# Patient Record
Sex: Female | Born: 1950 | Race: Black or African American | Hispanic: No | Marital: Married | Smoking: Former smoker
Health system: Southern US, Community
[De-identification: ages and names within clinical notes are randomized; demographics above are authoritative.]

## PROBLEM LIST (undated history)

## (undated) DIAGNOSIS — M65342 Trigger finger, left ring finger: Secondary | ICD-10-CM

## (undated) DIAGNOSIS — E7800 Pure hypercholesterolemia, unspecified: Secondary | ICD-10-CM

## (undated) DIAGNOSIS — I6529 Occlusion and stenosis of unspecified carotid artery: Secondary | ICD-10-CM

## (undated) DIAGNOSIS — E039 Hypothyroidism, unspecified: Secondary | ICD-10-CM

## (undated) DIAGNOSIS — F419 Anxiety disorder, unspecified: Secondary | ICD-10-CM

## (undated) DIAGNOSIS — M1711 Unilateral primary osteoarthritis, right knee: Secondary | ICD-10-CM

## (undated) DIAGNOSIS — T7840XA Allergy, unspecified, initial encounter: Secondary | ICD-10-CM

## (undated) DIAGNOSIS — I10 Essential (primary) hypertension: Secondary | ICD-10-CM

## (undated) HISTORY — DX: Allergy, unspecified, initial encounter: T78.40XA

## (undated) HISTORY — DX: Unilateral primary osteoarthritis, right knee: M17.11

## (undated) HISTORY — PX: TUBAL LIGATION: SHX77

## (undated) HISTORY — DX: Essential (primary) hypertension: I10

## (undated) HISTORY — PX: ABDOMINAL HYSTERECTOMY: SHX81

## (undated) HISTORY — DX: Hypothyroidism, unspecified: E03.9

## (undated) HISTORY — PX: THYROIDECTOMY: SHX17

## (undated) HISTORY — PX: SHOULDER SURGERY: SHX246

## (undated) HISTORY — DX: Occlusion and stenosis of unspecified carotid artery: I65.29

---

## 1999-02-01 ENCOUNTER — Encounter: Payer: Self-pay | Admitting: Family Medicine

## 1999-02-01 ENCOUNTER — Encounter: Admission: RE | Admit: 1999-02-01 | Discharge: 1999-02-01 | Payer: Self-pay | Admitting: *Deleted

## 2000-03-21 ENCOUNTER — Encounter: Admission: RE | Admit: 2000-03-21 | Discharge: 2000-03-21 | Payer: Self-pay | Admitting: Family Medicine

## 2000-03-21 ENCOUNTER — Encounter: Payer: Self-pay | Admitting: Family Medicine

## 2000-08-20 ENCOUNTER — Encounter: Admission: RE | Admit: 2000-08-20 | Discharge: 2000-08-20 | Payer: Self-pay | Admitting: Family Medicine

## 2000-08-20 ENCOUNTER — Encounter: Payer: Self-pay | Admitting: Family Medicine

## 2001-03-22 ENCOUNTER — Encounter: Admission: RE | Admit: 2001-03-22 | Discharge: 2001-03-22 | Payer: Self-pay | Admitting: Family Medicine

## 2001-03-22 ENCOUNTER — Encounter: Payer: Self-pay | Admitting: Family Medicine

## 2001-05-07 ENCOUNTER — Other Ambulatory Visit: Admission: RE | Admit: 2001-05-07 | Discharge: 2001-05-07 | Payer: Self-pay | Admitting: Family Medicine

## 2001-08-14 ENCOUNTER — Encounter: Admission: RE | Admit: 2001-08-14 | Discharge: 2001-11-12 | Payer: Self-pay | Admitting: Family Medicine

## 2002-08-18 ENCOUNTER — Encounter: Admission: RE | Admit: 2002-08-18 | Discharge: 2002-08-18 | Payer: Self-pay | Admitting: Family Medicine

## 2002-08-18 ENCOUNTER — Encounter: Payer: Self-pay | Admitting: Family Medicine

## 2003-01-05 ENCOUNTER — Encounter: Admission: RE | Admit: 2003-01-05 | Discharge: 2003-01-05 | Payer: Self-pay | Admitting: Family Medicine

## 2003-02-18 ENCOUNTER — Encounter (INDEPENDENT_AMBULATORY_CARE_PROVIDER_SITE_OTHER): Payer: Self-pay | Admitting: *Deleted

## 2003-02-18 ENCOUNTER — Ambulatory Visit (HOSPITAL_COMMUNITY): Admission: RE | Admit: 2003-02-18 | Discharge: 2003-02-18 | Payer: Self-pay | Admitting: Surgery

## 2003-08-17 ENCOUNTER — Encounter: Admission: RE | Admit: 2003-08-17 | Discharge: 2003-08-17 | Payer: Self-pay | Admitting: Family Medicine

## 2003-09-16 ENCOUNTER — Other Ambulatory Visit: Admission: RE | Admit: 2003-09-16 | Discharge: 2003-09-16 | Payer: Self-pay | Admitting: Family Medicine

## 2004-01-01 ENCOUNTER — Emergency Department (HOSPITAL_COMMUNITY): Admission: EM | Admit: 2004-01-01 | Discharge: 2004-01-01 | Payer: Self-pay | Admitting: Emergency Medicine

## 2004-09-15 ENCOUNTER — Encounter: Admission: RE | Admit: 2004-09-15 | Discharge: 2004-09-15 | Payer: Self-pay | Admitting: Family Medicine

## 2004-09-22 ENCOUNTER — Other Ambulatory Visit: Admission: RE | Admit: 2004-09-22 | Discharge: 2004-09-22 | Payer: Self-pay | Admitting: Family Medicine

## 2005-04-11 ENCOUNTER — Encounter: Admission: RE | Admit: 2005-04-11 | Discharge: 2005-04-11 | Payer: Self-pay | Admitting: Family Medicine

## 2005-12-18 ENCOUNTER — Encounter: Admission: RE | Admit: 2005-12-18 | Discharge: 2005-12-18 | Payer: Self-pay | Admitting: Family Medicine

## 2006-01-01 ENCOUNTER — Encounter: Admission: RE | Admit: 2006-01-01 | Discharge: 2006-01-01 | Payer: Self-pay | Admitting: Family Medicine

## 2006-06-16 ENCOUNTER — Emergency Department (HOSPITAL_COMMUNITY): Admission: EM | Admit: 2006-06-16 | Discharge: 2006-06-16 | Payer: Self-pay | Admitting: Emergency Medicine

## 2007-01-25 ENCOUNTER — Encounter: Admission: RE | Admit: 2007-01-25 | Discharge: 2007-01-25 | Payer: Self-pay | Admitting: Family Medicine

## 2008-01-01 ENCOUNTER — Other Ambulatory Visit: Admission: RE | Admit: 2008-01-01 | Discharge: 2008-01-01 | Payer: Self-pay | Admitting: Family Medicine

## 2008-01-03 ENCOUNTER — Encounter: Admission: RE | Admit: 2008-01-03 | Discharge: 2008-01-03 | Payer: Self-pay | Admitting: Family Medicine

## 2008-03-19 ENCOUNTER — Encounter: Admission: RE | Admit: 2008-03-19 | Discharge: 2008-03-19 | Payer: Self-pay | Admitting: Family Medicine

## 2009-01-15 ENCOUNTER — Encounter: Admission: RE | Admit: 2009-01-15 | Discharge: 2009-01-15 | Payer: Self-pay | Admitting: Family Medicine

## 2009-02-27 HISTORY — PX: KNEE SURGERY: SHX244

## 2009-03-18 ENCOUNTER — Emergency Department (HOSPITAL_COMMUNITY): Admission: EM | Admit: 2009-03-18 | Discharge: 2009-03-18 | Payer: Self-pay | Admitting: Emergency Medicine

## 2009-05-10 ENCOUNTER — Inpatient Hospital Stay (HOSPITAL_COMMUNITY): Admission: RE | Admit: 2009-05-10 | Discharge: 2009-05-12 | Payer: Self-pay | Admitting: Orthopedic Surgery

## 2009-05-18 ENCOUNTER — Inpatient Hospital Stay (HOSPITAL_COMMUNITY): Admission: EM | Admit: 2009-05-18 | Discharge: 2009-05-19 | Payer: Self-pay | Admitting: Emergency Medicine

## 2009-05-18 ENCOUNTER — Ambulatory Visit: Payer: Self-pay | Admitting: Vascular Surgery

## 2009-05-18 ENCOUNTER — Ambulatory Visit: Payer: Self-pay | Admitting: Oncology

## 2009-05-23 ENCOUNTER — Emergency Department (HOSPITAL_COMMUNITY): Admission: EM | Admit: 2009-05-23 | Discharge: 2009-05-23 | Payer: Self-pay | Admitting: Emergency Medicine

## 2009-06-15 ENCOUNTER — Ambulatory Visit (HOSPITAL_COMMUNITY): Admission: RE | Admit: 2009-06-15 | Discharge: 2009-06-15 | Payer: Self-pay | Admitting: Urology

## 2009-07-23 ENCOUNTER — Encounter: Admission: RE | Admit: 2009-07-23 | Discharge: 2009-07-23 | Payer: Self-pay | Admitting: Urology

## 2010-02-11 ENCOUNTER — Encounter
Admission: RE | Admit: 2010-02-11 | Discharge: 2010-02-11 | Payer: Self-pay | Source: Home / Self Care | Attending: Family Medicine | Admitting: Family Medicine

## 2010-03-19 ENCOUNTER — Encounter: Payer: Self-pay | Admitting: Family Medicine

## 2010-03-20 ENCOUNTER — Encounter: Payer: Self-pay | Admitting: Family Medicine

## 2010-03-20 ENCOUNTER — Encounter: Payer: Self-pay | Admitting: Urology

## 2010-04-27 ENCOUNTER — Other Ambulatory Visit: Payer: Self-pay | Admitting: Family Medicine

## 2010-05-17 LAB — COMPREHENSIVE METABOLIC PANEL
ALT: 23 U/L (ref 0–35)
AST: 27 U/L (ref 0–37)
Albumin: 3.5 g/dL (ref 3.5–5.2)
Alkaline Phosphatase: 76 U/L (ref 39–117)
BUN: 9 mg/dL (ref 6–23)
CO2: 31 mEq/L (ref 19–32)
Calcium: 9.7 mg/dL (ref 8.4–10.5)
Chloride: 105 mEq/L (ref 96–112)
Creatinine, Ser: 0.96 mg/dL (ref 0.4–1.2)
GFR calc Af Amer: >60 mL/min (ref >60)
GFR calc non Af Amer: 60 mL/min — ABNORMAL LOW (ref >60)
Glucose, Bld: 90 mg/dL (ref 70–99)
Potassium: 4.4 mEq/L (ref 3.5–5.1)
Sodium: 142 mEq/L (ref 135–145)
Total Bilirubin: 0.5 mg/dL (ref 0.3–1.2)
Total Protein: 7.4 g/dL (ref 6.0–8.3)

## 2010-05-17 LAB — CBC
HCT: 33.7 % — ABNORMAL LOW (ref 36.0–46.0)
Hemoglobin: 11 g/dL — ABNORMAL LOW (ref 12.0–15.0)
MCHC: 32.6 g/dL (ref 30.0–36.0)
MCV: 93.4 fL (ref 78.0–100.0)
Platelets: 328 10*3/uL (ref 150–400)
RBC: 3.6 MIL/uL — ABNORMAL LOW (ref 3.87–5.11)
RDW: 14 % (ref 11.5–15.5)
WBC: 5.3 10*3/uL (ref 4.0–10.5)

## 2010-05-17 LAB — PROTIME-INR
INR: 1.16 (ref 0.00–1.49)
INR: 1.65 — ABNORMAL HIGH (ref 0.00–1.49)
Prothrombin Time: 14.7 seconds (ref 11.6–15.2)
Prothrombin Time: 19.4 seconds — ABNORMAL HIGH (ref 11.6–15.2)

## 2010-05-17 LAB — APTT: aPTT: 43 seconds — ABNORMAL HIGH (ref 24–37)

## 2010-05-22 LAB — POCT I-STAT, CHEM 8
BUN: 12 mg/dL (ref 6–23)
BUN: 15 mg/dL (ref 6–23)
Calcium, Ion: 1.09 mmol/L — ABNORMAL LOW (ref 1.12–1.32)
Chloride: 105 mEq/L (ref 96–112)
Creatinine, Ser: 1.1 mg/dL (ref 0.4–1.2)
Creatinine, Ser: 1.2 mg/dL (ref 0.4–1.2)
Glucose, Bld: 106 mg/dL — ABNORMAL HIGH (ref 70–99)
Glucose, Bld: 92 mg/dL (ref 70–99)
HCT: 32 % — ABNORMAL LOW (ref 36.0–46.0)
Hemoglobin: 10.5 g/dL — ABNORMAL LOW (ref 12.0–15.0)
Hemoglobin: 10.9 g/dL — ABNORMAL LOW (ref 12.0–15.0)
Potassium: 3.9 mEq/L (ref 3.5–5.1)
Sodium: 137 meq/L (ref 135–145)
TCO2: 27 mmol/L (ref 0–100)
TCO2: 32 mmol/L (ref 0–100)

## 2010-05-22 LAB — DIFFERENTIAL
Basophils Relative: 0 % (ref 0–1)
Eosinophils Absolute: 0.5 10*3/uL (ref 0.0–0.7)
Eosinophils Relative: 5 % (ref 0–5)
Lymphs Abs: 1.8 10*3/uL (ref 0.7–4.0)
Monocytes Relative: 5 % (ref 3–12)

## 2010-05-22 LAB — CBC
HCT: 31.7 % — ABNORMAL LOW (ref 36.0–46.0)
Hemoglobin: 10.4 g/dL — ABNORMAL LOW (ref 12.0–15.0)
Hemoglobin: 9.8 g/dL — ABNORMAL LOW (ref 12.0–15.0)
MCHC: 33.3 g/dL (ref 30.0–36.0)
MCHC: 33.4 g/dL (ref 30.0–36.0)
MCHC: 33.8 g/dL (ref 30.0–36.0)
MCV: 94.1 fL (ref 78.0–100.0)
Platelets: 383 10*3/uL (ref 150–400)
Platelets: 534 10*3/uL — ABNORMAL HIGH (ref 150–400)
RBC: 3.24 MIL/uL — ABNORMAL LOW (ref 3.87–5.11)
RBC: 3.37 MIL/uL — ABNORMAL LOW (ref 3.87–5.11)
RDW: 13.5 % (ref 11.5–15.5)
RDW: 13.7 % (ref 11.5–15.5)
WBC: 11.2 10*3/uL — ABNORMAL HIGH (ref 4.0–10.5)

## 2010-05-22 LAB — LUPUS ANTICOAGULANT PANEL
Lupus Anticoagulant: NOT DETECTED
PTTLA 4:1 Mix: 84.5 secs — ABNORMAL HIGH (ref 36.3–48.8)
dRVVT Incubated 1:1 Mix: 42.5 secs (ref 36.2–44.3)

## 2010-05-22 LAB — POCT CARDIAC MARKERS: CKMB, poc: 2.3 ng/mL (ref 1.0–8.0)

## 2010-05-22 LAB — URINALYSIS, ROUTINE W REFLEX MICROSCOPIC
Bilirubin Urine: NEGATIVE
Ketones, ur: NEGATIVE mg/dL
Nitrite: NEGATIVE
Nitrite: NEGATIVE
Protein, ur: 100 mg/dL — AB
Specific Gravity, Urine: 1.018 (ref 1.005–1.030)
Urobilinogen, UA: 0.2 mg/dL (ref 0.0–1.0)
Urobilinogen, UA: 1 mg/dL (ref 0.0–1.0)
pH: 7 (ref 5.0–8.0)

## 2010-05-22 LAB — CARDIOLIPIN ANTIBODIES, IGG, IGM, IGA
Anticardiolipin IgA: 5 APL U/mL — ABNORMAL LOW (ref <22)
Anticardiolipin IgG: 3 GPL U/mL — ABNORMAL LOW (ref <23)
Anticardiolipin IgM: 6 MPL U/mL — ABNORMAL LOW (ref <11)

## 2010-05-22 LAB — TSH: TSH: 2.292 u[IU]/mL (ref 0.350–4.500)

## 2010-05-22 LAB — LIPID PANEL: VLDL: 16 mg/dL (ref 0–40)

## 2010-05-22 LAB — T4, FREE: Free T4: 0.99 ng/dL (ref 0.80–1.80)

## 2010-05-22 LAB — APTT: aPTT: 59 seconds — ABNORMAL HIGH (ref 24–37)

## 2010-05-22 LAB — PROTIME-INR
INR: 2.8 — ABNORMAL HIGH (ref 0.00–1.49)
INR: 3.25 — ABNORMAL HIGH (ref 0.00–1.49)
Prothrombin Time: 29.3 seconds — ABNORMAL HIGH (ref 11.6–15.2)
Prothrombin Time: 32.9 seconds — ABNORMAL HIGH (ref 11.6–15.2)

## 2010-05-22 LAB — FACTOR 5 LEIDEN

## 2010-05-22 LAB — HEPARIN LEVEL (UNFRACTIONATED)
Heparin Unfractionated: 0.91 IU/mL — ABNORMAL HIGH (ref 0.30–0.70)
Heparin Unfractionated: 1.29 IU/mL — ABNORMAL HIGH (ref 0.30–0.70)

## 2010-05-22 LAB — URINE MICROSCOPIC-ADD ON

## 2010-05-22 LAB — PROTHROMBIN GENE MUTATION

## 2010-05-23 LAB — BASIC METABOLIC PANEL
BUN: 13 mg/dL (ref 6–23)
BUN: 8 mg/dL (ref 6–23)
CO2: 27 mEq/L (ref 19–32)
CO2: 30 mEq/L (ref 19–32)
Chloride: 103 mEq/L (ref 96–112)
Chloride: 106 mEq/L (ref 96–112)
Creatinine, Ser: 0.73 mg/dL (ref 0.4–1.2)
GFR calc Af Amer: >60 mL/min (ref >60)
GFR calc non Af Amer: >60 mL/min (ref >60)
Glucose, Bld: 112 mg/dL — ABNORMAL HIGH (ref 70–99)
Glucose, Bld: 127 mg/dL — ABNORMAL HIGH (ref 70–99)
Glucose, Bld: 83 mg/dL (ref 70–99)
Potassium: 3.7 mEq/L (ref 3.5–5.1)
Potassium: 3.7 mEq/L (ref 3.5–5.1)
Potassium: 3.9 mEq/L (ref 3.5–5.1)
Sodium: 139 mEq/L (ref 135–145)

## 2010-05-23 LAB — CBC
HCT: 32.7 % — ABNORMAL LOW (ref 36.0–46.0)
HCT: 36.1 % (ref 36.0–46.0)
HCT: 40.4 % (ref 36.0–46.0)
Hemoglobin: 10.4 g/dL — ABNORMAL LOW (ref 12.0–15.0)
MCHC: 32.1 g/dL (ref 30.0–36.0)
MCV: 95.5 fL (ref 78.0–100.0)
Platelets: 178 10*3/uL (ref 150–400)
Platelets: 225 10*3/uL (ref 150–400)
RDW: 13.8 % (ref 11.5–15.5)
WBC: 6.4 10*3/uL (ref 4.0–10.5)

## 2010-05-23 LAB — URINALYSIS, ROUTINE W REFLEX MICROSCOPIC
Glucose, UA: NEGATIVE mg/dL
Ketones, ur: NEGATIVE mg/dL
Nitrite: NEGATIVE
Protein, ur: NEGATIVE mg/dL
Urobilinogen, UA: 0.2 mg/dL (ref 0.0–1.0)

## 2010-05-23 LAB — TYPE AND SCREEN: ABO/RH(D): A POS

## 2010-05-23 LAB — DIFFERENTIAL
Eosinophils Relative: 3 % (ref 0–5)
Lymphocytes Relative: 27 % (ref 12–46)
Lymphs Abs: 1.7 10*3/uL (ref 0.7–4.0)
Neutro Abs: 3.7 10*3/uL (ref 1.7–7.7)

## 2010-05-23 LAB — APTT: aPTT: 32 seconds (ref 24–37)

## 2010-05-23 LAB — PROTIME-INR
INR: 1.02 (ref 0.00–1.49)
Prothrombin Time: 13.3 seconds (ref 11.6–15.2)

## 2011-03-14 ENCOUNTER — Emergency Department (HOSPITAL_COMMUNITY)
Admission: EM | Admit: 2011-03-14 | Discharge: 2011-03-14 | Disposition: A | Discharge status: Home / Self Care | Payer: 59 | Attending: Emergency Medicine | Admitting: Emergency Medicine

## 2011-03-14 ENCOUNTER — Emergency Department (HOSPITAL_COMMUNITY): Payer: 59

## 2011-03-14 ENCOUNTER — Encounter (HOSPITAL_COMMUNITY): Payer: Self-pay | Admitting: *Deleted

## 2011-03-14 DIAGNOSIS — Z7982 Long term (current) use of aspirin: Secondary | ICD-10-CM | POA: Insufficient documentation

## 2011-03-14 DIAGNOSIS — R42 Dizziness and giddiness: Secondary | ICD-10-CM | POA: Insufficient documentation

## 2011-03-14 DIAGNOSIS — E7800 Pure hypercholesterolemia, unspecified: Secondary | ICD-10-CM | POA: Insufficient documentation

## 2011-03-14 DIAGNOSIS — Z79899 Other long term (current) drug therapy: Secondary | ICD-10-CM | POA: Insufficient documentation

## 2011-03-14 DIAGNOSIS — R4789 Other speech disturbances: Secondary | ICD-10-CM | POA: Insufficient documentation

## 2011-03-14 DIAGNOSIS — H538 Other visual disturbances: Secondary | ICD-10-CM | POA: Insufficient documentation

## 2011-03-14 DIAGNOSIS — R4701 Aphasia: Secondary | ICD-10-CM | POA: Insufficient documentation

## 2011-03-14 HISTORY — DX: Pure hypercholesterolemia, unspecified: E78.00

## 2011-03-14 LAB — CBC
HCT: 42.1 % (ref 36.0–46.0)
MCV: 94.2 fL (ref 78.0–100.0)
RBC: 4.47 MIL/uL (ref 3.87–5.11)
RDW: 12.7 % (ref 11.5–15.5)
WBC: 4.9 10*3/uL (ref 4.0–10.5)

## 2011-03-14 LAB — BASIC METABOLIC PANEL
BUN: 9 mg/dL (ref 6–23)
CO2: 28 mEq/L (ref 19–32)
Calcium: 10 mg/dL (ref 8.4–10.5)
Creatinine, Ser: 0.93 mg/dL (ref 0.50–1.10)
Glucose, Bld: 97 mg/dL (ref 70–99)

## 2011-03-14 LAB — DIFFERENTIAL
Eosinophils Relative: 1 % (ref 0–5)
Lymphocytes Relative: 27 % (ref 12–46)
Lymphs Abs: 1.3 10*3/uL (ref 0.7–4.0)
Monocytes Absolute: 0.8 10*3/uL (ref 0.1–1.0)
Monocytes Relative: 15 % — ABNORMAL HIGH (ref 3–12)

## 2011-03-14 LAB — URINALYSIS, ROUTINE W REFLEX MICROSCOPIC
Glucose, UA: NEGATIVE mg/dL
Hgb urine dipstick: NEGATIVE
Ketones, ur: NEGATIVE mg/dL
Protein, ur: NEGATIVE mg/dL

## 2011-03-14 MED ORDER — MECLIZINE HCL 50 MG PO TABS: 50.0000 mg | ORAL_TABLET | Freq: Three times a day (TID) | ORAL | Status: AC | PRN

## 2011-03-14 MED ORDER — LORAZEPAM 1 MG PO TABS
1.0000 mg | ORAL_TABLET | Freq: Once | ORAL | Status: AC
  Administered 2011-03-14: 1 mg via ORAL
  Filled 2011-03-14: qty 1

## 2011-03-14 NOTE — ED Notes (Signed)
Refused pain med for h/a when offerred.

## 2011-03-14 NOTE — ED Notes (Signed)
Patient transported to MRI 

## 2011-03-14 NOTE — ED Notes (Signed)
Returned from MRI. Status unchanged.  Informed patient and/or family of status. Awaiting test results.

## 2011-03-14 NOTE — ED Notes (Signed)
Patient transported to CT 

## 2011-03-14 NOTE — ED Notes (Signed)
Reports intermittent episodes dizziness, lightheadedness followed by h/a x 1 yr. Denies dizziness presently, but does c/o h/a between eyes. No facial droop, slurred speech, visual changes, weakness to upper or lower extremities. Denies recent illness, fever, cold, cough, n/v/d.

## 2011-03-14 NOTE — ED Provider Notes (Signed)
Medical screening examination/treatment/procedure(s) were performed by non-physician practitioner and as supervising physician I was immediately available for consultation/collaboration.   P , MD 03/14/11 1633 

## 2011-03-14 NOTE — ED Provider Notes (Signed)
Persistent dizziness, worsen for the past 2 days.  .  Has normal head CT.  Currently awaits brain MRI.  If negative, pt to have f/u with neuro outpatient.  Pt report given to Felicie Morn, NP at end of shift.   Fayrene Helper, PA-C 03/14/11 1514

## 2011-03-14 NOTE — ED Notes (Signed)
Pt is here with dizziness with sitting and standing for about one year.  No falls.  Pt sts that on Sunday vision went black but she did not pass out.  Speech seems different to daughter

## 2011-03-14 NOTE — ED Provider Notes (Signed)
History     CSN: [id number]  Arrival date & time 03/14/11  1105   First MD Initiated Contact with Patient 03/14/11 1139      Chief Complaint  Patient presents with  . Dizziness    (Consider location/radiation/quality/duration/timing/severity/associated sxs/prior treatment) HPI History provided by pt.   Pt has had intermittent spells of swimmy-headedness for the past year.  Episodes have recently become more frequent and nearly constant over past two days.  Non-positional and non-exertional.  Associated w/ blurred vision and decreased balance.  2 days ago, pt's vision went black for a few seconds upon standing from bent over position.  Had simultaneous lightheadedness and her husband reports that she was clammy.   Her husband and daughter also report that pt has had intermittent slurred speech, expressive aphasia and directional hearing impairment (for ex: can't not distinguish which direction an ambulance siren is coming from) and she is not as sharp as she normally is.  Pt denies dysphagia extremity weakness/paresthesias.  No head trauma.  No personal or FH of stroke.  No recent illnesses. Evaluated by her doctor yesterday and diagnosed w/ orthostatic hypotension.   Past Medical History  Diagnosis Date  . Immune deficiency disorder   . Hypercholesterolemia   . Thyroid disease     Past Surgical History  Procedure Date  . Knee surgery   . Shoulder surgery   . Abdominal hysterectomy   . Thyroidectomy     No family history on file.  History  Substance Use Topics  . Smoking status: Never Smoker   . Smokeless tobacco: Not on file  . Alcohol Use: No    OB History    Grav Para Term Preterm Abortions TAB SAB Ect Mult Living                  Review of Systems  All other systems reviewed and are negative.    Allergies  Aspirin  Home Medications   Current Outpatient Rx  Name Route Sig Dispense Refill  . ASPIRIN EC 81 MG PO TBEC Oral Take 81 mg by mouth daily.    .  ESTER C PO Oral Take 500 mg by mouth daily.    Marland Kitchen BLACK COHOSH 540 MG PO CAPS Oral Take 1 capsule by mouth daily.    Marland Kitchen CALCIUM CARBONATE-VITAMIN D 500-200 MG-UNIT PO TABS Oral Take 1 tablet by mouth daily.    Marland Kitchen GINKGO BILOBA 40 MG PO TABS Oral Take 65 mg by mouth 2 (two) times daily.    Marland Kitchen GLUCOSAMINE PO Oral Take 1,000 mg by mouth 2 (two) times daily.    Marland Kitchen LEVOTHYROXINE SODIUM 75 MCG PO TABS Oral Take 75 mcg by mouth daily.    Marland Kitchen METOPROLOL SUCCINATE ER 50 MG PO TB24 Oral Take 25 mg by mouth daily. Take with or immediately following a meal.    . SIMVASTATIN 40 MG PO TABS Oral Take 40 mg by mouth every evening.    . SUPER B COMPLEX/C PO Oral Take 1 tablet by mouth daily.    Marland Kitchen VITAMIN E 400 UNITS PO CAPS Oral Take 400 Units by mouth daily.      BP 154/70  Pulse 70  Temp(Src) 98.9 F (37.2 C) (Oral)  Resp 20  SpO2 99%  Physical Exam  Nursing note and vitals reviewed. Constitutional: She is oriented to person, place, and time. She appears well-developed and well-nourished. No distress.  HENT:  Head: Normocephalic and atraumatic.  Eyes:  Normal appearance  Neck: Normal range of motion.  Cardiovascular: Normal rate, regular rhythm and intact distal pulses.   Pulmonary/Chest: Effort normal and breath sounds normal.  Musculoskeletal: Normal range of motion.  Neurological: She is alert and oriented to person, place, and time. She has normal reflexes. She displays no tremor. No cranial nerve deficit or sensory deficit. She displays a negative Romberg sign. Coordination normal.       5/5 and equal upper and lower extremity strength.  No past pointing.  No pronator drift.  No nystagmus.  Gait nml but patient reports feeling off balance and was wobbly immediately upon standing from bed.   Skin: Skin is warm and dry. No rash noted.  Psychiatric: She has a normal mood and affect. Her behavior is normal.    ED Course  Procedures (including critical care time)  Labs Reviewed  DIFFERENTIAL -  Abnormal; Notable for the following:    Monocytes Relative 15 (*)    All other components within normal limits  BASIC METABOLIC PANEL - Abnormal; Notable for the following:    GFR calc non Af Amer 65 (*)    GFR calc Af Amer 75 (*)    All other components within normal limits  CBC  URINALYSIS, ROUTINE W REFLEX MICROSCOPIC   Ct Head Wo Contrast  03/14/2011  *RADIOLOGY REPORT*  Clinical Data:  Dizziness  CT HEAD WITHOUT CONTRAST  Technique:  Contiguous axial images were obtained from the base of the skull through the vertex without contrast  Comparison:  None.  Findings:  The brain has a normal appearance without evidence for hemorrhage, acute infarction, hydrocephalus, or mass lesion.  There is no extra axial fluid collection.  The skull and paranasal sinuses are normal.  IMPRESSION: Normal CT of the head without contrast.  Original Report Authenticated By: Camelia Phenes, M.D.     No diagnosis found.    MDM  Pt presents w/ c/o intermittent, non-positional dizziness x 1 yr.  Sx acutely worsened 2 days ago and her family has also noticed slurred speech, expressive aphasia, "directional hearing impairment" and confusion during that time.  No focal neuro deficits on exam w/ exception that pt was wobbly upon standing from bed and reported poor balance while ambulating.  CT head neg and labs unremarkable.  Pt will be moved to CDU for MRI brain.  If neg, will d/c home w/ referral to neuro.  Pt and Dr. Weldon Inches in agreement w/ plan.    MRI neg.  Pt discharged.  Prescribed meclizine for her to trial.  4:28 PM         Otilio Miu, PA 03/14/11 1522  Otilio Miu, [place] 03/14/11 [id number]

## 2011-03-17 NOTE — ED Provider Notes (Signed)
History/physical exam/procedure(s) were performed by non-physician practitioner and as supervising physician I was immediately available for consultation/collaboration. I have reviewed all notes and am in agreement with care and plan.   Hilario Quarry, MD 03/17/11 2136

## 2011-08-17 ENCOUNTER — Other Ambulatory Visit: Payer: Self-pay | Admitting: Family Medicine

## 2011-08-17 DIAGNOSIS — Z1231 Encounter for screening mammogram for malignant neoplasm of breast: Secondary | ICD-10-CM

## 2011-08-22 ENCOUNTER — Ambulatory Visit
Admission: RE | Admit: 2011-08-22 | Discharge: 2011-08-22 | Disposition: A | Discharge status: Home / Self Care | Payer: 59 | Source: Ambulatory Visit | Attending: Family Medicine | Admitting: Family Medicine

## 2011-08-22 DIAGNOSIS — Z1231 Encounter for screening mammogram for malignant neoplasm of breast: Secondary | ICD-10-CM

## 2012-02-12 ENCOUNTER — Other Ambulatory Visit: Payer: Self-pay | Admitting: Oncology

## 2012-02-12 NOTE — Progress Notes (Signed)
Received office notes from Dr. Thurston Hole @ [facility]; forwarded to Dr. Gaylyn Rong.

## 2012-02-16 ENCOUNTER — Other Ambulatory Visit: Payer: 59

## 2012-02-16 ENCOUNTER — Ambulatory Visit (INDEPENDENT_AMBULATORY_CARE_PROVIDER_SITE_OTHER): Payer: 59 | Admitting: Critical Care Medicine

## 2012-02-16 ENCOUNTER — Encounter: Payer: Self-pay | Admitting: Critical Care Medicine

## 2012-02-16 VITALS — BP 136/80 | HR 75 | Temp 98.4°F | Ht 69.5 in | Wt 238.4 lb

## 2012-02-16 DIAGNOSIS — I878 Other specified disorders of veins: Secondary | ICD-10-CM

## 2012-02-16 DIAGNOSIS — I2699 Other pulmonary embolism without acute cor pulmonale: Secondary | ICD-10-CM

## 2012-02-16 DIAGNOSIS — E039 Hypothyroidism, unspecified: Secondary | ICD-10-CM | POA: Insufficient documentation

## 2012-02-16 DIAGNOSIS — E079 Disorder of thyroid, unspecified: Secondary | ICD-10-CM | POA: Insufficient documentation

## 2012-02-16 DIAGNOSIS — I872 Venous insufficiency (chronic) (peripheral): Secondary | ICD-10-CM

## 2012-02-16 DIAGNOSIS — I10 Essential (primary) hypertension: Secondary | ICD-10-CM | POA: Insufficient documentation

## 2012-02-16 DIAGNOSIS — E7800 Pure hypercholesterolemia, unspecified: Secondary | ICD-10-CM | POA: Insufficient documentation

## 2012-02-16 NOTE — Assessment & Plan Note (Signed)
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 Now needs R TKR.  No DVT seen at last episode and no hypercoaguable state seen Plan Hypercoaguable labs today A venous doppler u/s of the lower extremities will be obtained This pt can be cleared for planned orthopedic surgery Will likely need just lovenox proph dose ASAP after surgery. I do not feel she will need preop anticoagulation

## 2012-02-16 NOTE — Progress Notes (Signed)
Subjective:    Patient ID: Kelly Watson, female    DOB: Jul 21, 1950, 61 y.o.   MRN: [id number]  HPI Comments: Preop surgical clearance for prior PE issue Needs R TKR 2/14.    Hx of PE with last TKR on L in 2011.  Now off all blood thinners.  Took meds only for three weeks after surgery.  Only took coumadin for more than three weeks No current dyspnea issues.  Now no real chest pain.  No real cough.  Notes some edema in legs. No preop u/s done as of yet.  Hx of venous insuff .  No prior know DVT. Weight now 238, has been 10# heavier before   Past Medical History  Diagnosis Date  . Hypercholesterolemia   . Hypothyroidism   . Hypertension   . Pulmonary embolism      Family History  Problem Relation Age of Onset  . Asthma Daughter   . Breast cancer Mother   . Breast cancer Maternal Grandmother      History   Social History  . Marital Status: Married    Spouse Name: N/A    Number of Children: N/A  . Years of Education: N/A   Occupational History  . Customer Care Rep     [employer]    Social History Main Topics  . Smoking status: Former Smoker -- 0.5 packs/day for 15 years    Types: Cigarettes    Quit date: 02/27/1985  . Smokeless tobacco: Never Used  . Alcohol Use: No  . Drug Use: No  . Sexually Active: Not on file   Other Topics Concern  . Not on file   Social History Narrative  . No narrative on file     Allergies  Allergen Reactions  . Aspirin Hives  . Tramadol     Over sedates     Outpatient Prescriptions Prior to Visit  Medication Sig Dispense Refill  . Bioflavonoid Products (ESTER C PO) Take 500 mg by mouth daily.      . calcium-vitamin D (OSCAL WITH D) 500-200 MG-UNIT per tablet Take 1 tablet by mouth daily.      Marland Kitchen GLUCOSAMINE PO Take 1,000 mg by mouth 2 (two) times daily.      Marland Kitchen levothyroxine (SYNTHROID, LEVOTHROID) 75 MCG tablet Take 75 mcg by mouth daily.      . metoprolol succinate (TOPROL-XL) 50 MG 24 hr tablet Take 25 mg by mouth daily. Take  with or immediately following a meal.      . simvastatin (ZOCOR) 40 MG tablet Take 40 mg by mouth every evening.      . SUPER B COMPLEX/C PO Take 2 tablets by mouth daily.       . vitamin E 400 UNIT capsule Take 800 Units by mouth daily.       . [DISCONTINUED] aspirin EC 81 MG tablet Take 81 mg by mouth daily.      . [DISCONTINUED] Black Cohosh 540 MG CAPS Take 1 capsule by mouth daily.      . [DISCONTINUED] Ginkgo Biloba 40 MG TABS Take 65 mg by mouth 2 (two) times daily.       Last reviewed on 02/16/2012  4:16 PM by Storm Frisk, MD    Review of Systems  Constitutional: Negative for fever, chills, diaphoresis, activity change, appetite change, fatigue and unexpected weight change.  HENT: Positive for neck pain, postnasal drip and sinus pressure. Negative for hearing loss, ear pain, nosebleeds, congestion, sore throat, facial swelling, rhinorrhea,  sneezing, mouth sores, trouble swallowing, neck stiffness, dental problem, voice change, tinnitus and ear discharge.   Eyes: Negative for photophobia, discharge, itching and visual disturbance.  Respiratory: Negative for apnea, cough, choking, chest tightness, shortness of breath, wheezing and stridor.   Cardiovascular: Positive for leg swelling. Negative for chest pain and palpitations.  Gastrointestinal: Negative for nausea, vomiting, abdominal pain, constipation, blood in stool and abdominal distention.  Genitourinary: Negative for dysuria, urgency, frequency, hematuria, flank pain, decreased urine volume and difficulty urinating.  Musculoskeletal: Positive for joint swelling, arthralgias and gait problem. Negative for myalgias and back pain.  Skin: Negative for color change, pallor and rash.  Neurological: Negative for dizziness, tremors, seizures, syncope, speech difficulty, weakness, light-headedness, numbness and headaches.  Hematological: Negative for adenopathy. Does not bruise/bleed easily.  Psychiatric/Behavioral: Positive for sleep  disturbance. Negative for confusion and agitation. The patient is nervous/anxious.        Objective:   Physical Exam Filed Vitals:   02/16/12 1603  BP: 136/80  Pulse: 75  Temp: 98.4 F (36.9 C)  TempSrc: Oral  Height: 5' 9.5" (1.765 m)  Weight: 238 lb 6.4 oz (108.138 kg)  SpO2: 97%    Gen: Pleasant, obese, in no distress,  normal affect  ENT: No lesions,  mouth clear,  oropharynx clear, no postnasal drip  Neck: No JVD, no TMG, no carotid bruits  Lungs: No use of accessory muscles, no dullness to percussion, clear without rales or rhonchi  Cardiovascular: RRR, heart sounds normal, no murmur or gallops, no peripheral edema, varicose veins in LE  Abdomen: soft and NT, no HSM,  BS normal  Musculoskeletal: No deformities, no cyanosis or clubbing  Neuro: alert, non focal  Skin: Warm, no lesions or rashes         Assessment & Plan:   Pulmonary embolism 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 Now needs R TKR.  No DVT seen at last episode and no hypercoaguable state seen Plan Hypercoaguable labs today A venous doppler u/s of the lower extremities will be obtained This pt can be cleared for planned orthopedic surgery Will likely need just lovenox proph dose ASAP after surgery. I do not feel she will need preop anticoagulation   Updated Medication List Outpatient Encounter Prescriptions as of 02/16/2012  Medication Sig Dispense Refill  . Bioflavonoid Products (ESTER C PO) Take 500 mg by mouth daily.      . calcium-vitamin D (OSCAL WITH D) 500-200 MG-UNIT per tablet Take 1 tablet by mouth daily.      . ferrous sulfate 325 (65 FE) MG tablet Take 2 tablets by mouth daily.      Marland Kitchen GLUCOSAMINE PO Take 1,000 mg by mouth 2 (two) times daily.      Marland Kitchen levothyroxine (SYNTHROID, LEVOTHROID) 75 MCG tablet Take 75 mcg by mouth daily.      Marland Kitchen LORazepam (ATIVAN) 0.5 MG tablet Take 1 tablet by mouth as needed.      . magnesium oxide (MAG-OX) 400 MG tablet Take 800 mg by mouth daily.      .  Melatonin 10 MG CAPS Take 1 tablet by mouth at bedtime.      . metoprolol succinate (TOPROL-XL) 50 MG 24 hr tablet Take 25 mg by mouth daily. Take with or immediately following a meal.      . Multiple Vitamin (MULTIVITAMIN) tablet Take 2 tablets by mouth daily.      . Omega-3 Fatty Acids (FISH OIL) 300 MG CAPS Take 1 capsule by mouth daily.      Marland Kitchen  OVER THE COUNTER MEDICATION Calm forte as needed      . OVER THE COUNTER MEDICATION Restful legs as needed      . potassium gluconate (HM POTASSIUM) 595 MG TABS Take 595 mg by mouth 2 (two) times daily.      . simvastatin (ZOCOR) 40 MG tablet Take 40 mg by mouth every evening.      . SUPER B COMPLEX/C PO Take 2 tablets by mouth daily.       . vitamin E 400 UNIT capsule Take 800 Units by mouth daily.       . VOLTAREN 1 % GEL as needed.      . [DISCONTINUED] aspirin EC 81 MG tablet Take 81 mg by mouth daily.      . [DISCONTINUED] Black Cohosh 540 MG CAPS Take 1 capsule by mouth daily.      . [DISCONTINUED] Ginkgo Biloba 40 MG TABS Take 65 mg by mouth 2 (two) times daily.

## 2012-02-16 NOTE — Patient Instructions (Addendum)
Hypercoaguable labs today A venous doppler u/s of the lower extremities will be obtained I will call with results

## 2012-02-17 LAB — HOMOCYSTEINE: Homocysteine: 11.1 umol/L (ref 4.0–15.4)

## 2012-02-19 ENCOUNTER — Ambulatory Visit (HOSPITAL_BASED_OUTPATIENT_CLINIC_OR_DEPARTMENT_OTHER): Payer: 59 | Admitting: Oncology

## 2012-02-19 ENCOUNTER — Other Ambulatory Visit: Payer: 59 | Admitting: Lab

## 2012-02-19 ENCOUNTER — Ambulatory Visit: Payer: 59

## 2012-02-19 ENCOUNTER — Telehealth: Payer: Self-pay | Admitting: Oncology

## 2012-02-19 ENCOUNTER — Encounter: Payer: Self-pay | Admitting: Oncology

## 2012-02-19 VITALS — BP 153/76 | HR 65 | Temp 98.4°F | Resp 20 | Ht 69.5 in | Wt 234.8 lb

## 2012-02-19 DIAGNOSIS — M542 Cervicalgia: Secondary | ICD-10-CM

## 2012-02-19 DIAGNOSIS — I2699 Other pulmonary embolism without acute cor pulmonale: Secondary | ICD-10-CM

## 2012-02-19 DIAGNOSIS — Z86711 Personal history of pulmonary embolism: Secondary | ICD-10-CM

## 2012-02-19 DIAGNOSIS — E079 Disorder of thyroid, unspecified: Secondary | ICD-10-CM

## 2012-02-19 DIAGNOSIS — M25519 Pain in unspecified shoulder: Secondary | ICD-10-CM

## 2012-02-19 LAB — LUPUS ANTICOAGULANT PANEL: Lupus Anticoagulant: NOT DETECTED

## 2012-02-19 LAB — CARDIOLIPIN ANTIBODIES, IGG, IGM, IGA: Anticardiolipin IgG: 13 GPL U/mL (ref <23)

## 2012-02-19 LAB — ANTITHROMBIN III: AntiThromb III Func: 114 % (ref 76–126)

## 2012-02-19 LAB — PROTEIN S ACTIVITY: Protein S Activity: 115 % (ref 69–129)

## 2012-02-19 LAB — PROTHROMBIN GENE MUTATION

## 2012-02-19 LAB — PROTEIN C ACTIVITY: Protein C Activity: 197 % — ABNORMAL HIGH (ref 75–133)

## 2012-02-19 NOTE — Progress Notes (Signed)
[facility]  [phone] [phone]     INITIAL HEMATOLOGY CONSULTATION    Referral MD:  Dr. Sigmund Hazel, M.D.   Reason for Referral: history of postop pulmonary embolism.   HPI:  Kelly Watson. Kelly Watson is a 61 year old woman.  In 2011, she underwent left knee replacement.  She was placed on Coumadin immediately post op.  About 8-10 days post op, she developed chest pain and SOB.  On 05/18/2009, CT angio showed subsegmental PE.  At the time, her INR was 2.8.  She was placed on Lovenox for about 1-2 months with complete resolution of her SOB and chest pain.  She had bright red blood hematuria on Lovenox with completely negative Urology work up.  Her hematuria completely resolved on its own.  She stopped Lovenox after about 1-2 months; she could not remember the exact duration.  Since stopping Lovenox, she has not had any recurrent thrombotic event. Before 2011, she did not have any known, documented thrombotic event.   She has had worsening right knee pain despite conservative management.  She is undergoing evaluation for right knee replacement.  She was thus kindly referred to the Cancer Center due to history of postop PE.  [name] presented to the [facility] today for evaluation with her husband.  She reported right knee pain.  Pain is worst when she ambulates or prolonged weight bearing.  She also has left hip pain which is much milder than the right knee pain which she thinks is from overcompensating for the right knee pain.  She reported history of partial thyroidectomy for benign thyroid nodule.  She thinks that her right thyroid is enlarging within the past few weeks.  She denied dysphagia, hoarse voice.  She also complained of right shoulder pain when she raises her right arm or sleeping on her right shoulder.   Patient denies fever, anorexia, weight loss, fatigue, headache, visual changes, confusion, drenching night sweats, palpable  lymph node swelling, mucositis, odynophagia, dysphagia, nausea vomiting, jaundice, chest pain, palpitation, shortness of breath, dyspnea on exertion, productive cough, gum bleeding, epistaxis, hematemesis, hemoptysis, abdominal pain, abdominal swelling, early satiety, melena, hematochezia, hematuria, skin rash, spontaneous bleeding, heat or cold intolerance, bowel bladder incontinence, back pain, focal motor weakness, paresthesia, depression, suicidal or homicidal ideation, feeling hopelessness.    Past Medical History  Diagnosis Date  . Hypercholesterolemia   . Hypothyroidism   . Hypertension   . Pulmonary embolism 2011  :    Past Surgical History  Procedure Date  . Knee surgery   . Shoulder surgery     right rotator cuff  . Abdominal hysterectomy     for fibroid  . Thyroidectomy     partial; due to benign thyroid nodule.   . Tubal ligation   :   CURRENT MEDS: Current Outpatient Prescriptions  Medication Sig Dispense Refill  . Bioflavonoid Products (ESTER C PO) Take 500 mg by mouth daily.      . calcium-vitamin D (OSCAL WITH D) 500-200 MG-UNIT per tablet Take 1 tablet by mouth daily.      . ferrous sulfate 325 (65 FE) MG tablet Take 2 tablets by mouth daily.      Marland Kitchen GLUCOSAMINE PO Take 1,000 mg by mouth 2 (two) times daily.      Marland Kitchen levothyroxine (SYNTHROID, LEVOTHROID) 75 MCG tablet Take 75 mcg by mouth daily.      Marland Kitchen LORazepam (ATIVAN) 0.5 MG tablet Take 1 tablet by mouth as  needed.      . magnesium oxide (MAG-OX) 400 MG tablet Take 800 mg by mouth daily.      . Melatonin 10 MG CAPS Take 1 tablet by mouth at bedtime.      . metoprolol succinate (TOPROL-XL) 50 MG 24 hr tablet Take 25 mg by mouth daily. Take with or immediately following a meal.      . Multiple Vitamin (MULTIVITAMIN) tablet Take 2 tablets by mouth daily.      . Omega-3 Fatty Acids (FISH OIL) 300 MG CAPS Take 1 capsule by mouth daily.      Marland Kitchen OVER THE COUNTER MEDICATION Calm forte as needed      . OVER THE  COUNTER MEDICATION Restful legs as needed      . potassium gluconate (HM POTASSIUM) 595 MG TABS Take 595 mg by mouth 2 (two) times daily.      . simvastatin (ZOCOR) 40 MG tablet Take 40 mg by mouth every evening.      . SUPER B COMPLEX/C PO Take 2 tablets by mouth daily.       . vitamin E 400 UNIT capsule Take 800 Units by mouth daily.       . VOLTAREN 1 % GEL as needed.          Allergies  Allergen Reactions  . Aspirin Hives  . Tramadol     Over sedates  :  Family History  Problem Relation Age of Onset  . Asthma Daughter   . Breast cancer Mother   . Breast cancer Maternal Grandmother   . Cancer Brother     prostate  :  History   Social History  . Marital Status: Married    Spouse Name: N/A    Number of Children: 2  . Years of Education: N/A   Occupational History  . Customer Care Rep     [employer]    Social History Main Topics  . Smoking status: Former Smoker -- 0.5 packs/day for 15 years    Types: Cigarettes    Quit date: 02/27/1985  . Smokeless tobacco: Never Used  . Alcohol Use: No  . Drug Use: No  . Sexually Active: Not on file   Other Topics Concern  . Not on file   Social History Narrative  . No narrative on file  :  REVIEW OF SYSTEM:  The rest of the 14-point review of sytem was negative.   Exam: ECOG 0.   General:  well-nourished woman, in no acute distress.  Eyes:  no scleral icterus.  ENT:  There were no oropharyngeal lesions.  Neck showed right more than left thyromegaly.   Lymphatics:  Negative cervical, supraclavicular or axillary adenopathy.  Respiratory: lungs were clear bilaterally without wheezing or crackles.  Cardiovascular:  Regular rate and rhythm, S1/S2, without murmur, rub or gallop.  There was no pedal edema.  GI:  abdomen was soft, flat, nontender, nondistended, without organomegaly.  Muscoloskeletal:  no spinal tenderness of palpation of vertebral spine.  There was mild right knee pain on palpation but without erythema, increased  warmth.  There was right arm pain in the right shoulder upon raising her right arm above her shoulder.   Skin exam was without echymosis, petichae.  Neuro exam was nonfocal.  Patient was able to get on and off exam table without assistance.  Gait was normal.  Patient was alerted and oriented.  Attention was good.   Language was appropriate.  Mood was normal without depression.  Speech  was not pressured.  Thought content was not tangential.    LABS:  Lab Results  Component Value Date   WBC 4.9 03/14/2011   HGB 13.9 03/14/2011   HCT 42.1 03/14/2011   PLT 209 03/14/2011   GLUCOSE 97 03/14/2011   CHOL  Value: 134        ATP III CLASSIFICATION:  <200     mg/dL   Desirable  119-147  mg/dL   Borderline High  >=829    mg/dL   High        5/62/1308   TRIG 81 05/19/2009   HDL 43 05/19/2009   LDLCALC  Value: 75        Total Cholesterol/HDL:CHD Risk Coronary Heart Disease Risk Table                     Men   Women  1/2 Average Risk   3.4   3.3  Average Risk       5.0   4.4  2 X Average Risk   9.6   7.1  3 X Average Risk  23.4   11.0        Use the calculated Patient Ratio above and the CHD Risk Table to determine the patient's CHD Risk.        ATP III CLASSIFICATION (LDL):  <100     mg/dL   Optimal  657-846  mg/dL   Near or Above                    Optimal  130-159  mg/dL   Borderline  962-952  mg/dL   High  >841     mg/dL   Very High 05/20/4008   ALT 23 06/11/2009   AST 27 06/11/2009   NA 142 03/14/2011   K 4.2 03/14/2011   CL 104 03/14/2011   CREATININE 0.93 03/14/2011   BUN 9 03/14/2011   CO2 28 03/14/2011   INR 1.16 06/15/2009    ASSESSMENT AND PLAN:    1. History of pulmonary embolism (PE) with previous orthopedic surgery in 2011. - Impression:  This was a provoked event despite therapeutic INR on Coumadin.  Her thrombophilia workup in 2011 was negative.  Pulmonary sent this again last week; results are pending.  -Recommendation:  *  Preop:  No indication for preop anticoagulation as previous event was a  provoked event (orthopedic surgery, with limited post op prophylactic anticoagulation).  OK to proceed with right knee replacement per hematology standpoint.   *  Post op:  Start blood thinner immediately after surgery if hemostasis allows.  3 months of prophylactic anticoagulation.  This is longer than the routine recommendation of 6 weeks.  However, given past PE, I would recommend 3 months.  *  Choices of anticoagulation:   -  Coumadin is not a good option since patient developed blood clot in 2011 on Coumadin.    -  Lovenox SQ injection 1mg /kg twice daily.  This is my choice.     -  Xarelto oral:  Pros:  Slightly less chance of bleeding than Coumadin.  Also, no need to monitor.  Cons:  Much more expensive.  Also, if there were bleeding, there is no known antidote to reverse bleeding.   Given history of hematuria,  this is not a good option when there is no antidote.   2.  Hematuria history in 2011 on Lovenox with negative Urology work up.  I will monitor this when he resumes  Lovenox in 03/2012.   3.  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  However, need to rule out malignant thyroid cancer given symptomatology.   I requested [place] of neck, shoulder, and thyroid.  Depending on findings, I may refer to IR for biopsy or to ENT/Gen Surgery.   4.  Follow up:  Post op in Feb 2014.        The length of time of the face-to-face encounter was 60 minutes. More than 50% of time was spent counseling and coordination of care.     Thank you for this referral.

## 2012-02-19 NOTE — Patient Instructions (Addendum)
1.  Issue:  History of pulmonary embolism (PE) with previous orthopedic surgery. 2.  Recommendation:  *  Preop:  No indication for preop anticoagulation as previous event was a provoked event (orthopedic surgery, with limited post op prophylactic anticoagulation).  *  Post op:  Start blood thinner immediately after surgery if hemostasis allows.  3 months of prophylactic anticoagulation.  This is longer than the routine recommendation of 6 weeks.  However, given past PE, I would recommend 3 months.  *  Choices of anticoagulation:   -  Coumadin is not a good option since patient developed blood clot in 2011 on Coumadin.    -  Lovenox SQ injection 1mg /kg twice daily.  This is my choice.    -  Xarelto oral:  Pros:  Slightly less chance of bleeding than Coumadin.  Also, no need to monitor.  Cons:  Much more expensive.  Also, there is bleeding, there is no known antidote to reverse bleeding.   Given history of hematuria (blood in the urine), this is not a good option when there is no antidote.   3.  Follow up:  Post op in Feb 2014.

## 2012-02-19 NOTE — Telephone Encounter (Signed)
appts made and printed for pt pt aware that cen. sch will call with the u/s appt     anne

## 2012-02-20 LAB — PROTEIN C, TOTAL: Protein C, Total: 98 % (ref 72–160)

## 2012-02-20 LAB — PROTEIN S, TOTAL: Protein S Total: 104 % (ref 60–150)

## 2012-02-22 ENCOUNTER — Telehealth: Payer: Self-pay | Admitting: Critical Care Medicine

## 2012-02-22 NOTE — Telephone Encounter (Signed)
Was calling to inform pt:  Result Note     Call the pt and tell her so far all hypercoaguable labs are normal. There a few more to result and will call when available   -----  lmomtcb to inform pt.

## 2012-02-22 NOTE — Progress Notes (Signed)
Quick Note:  Called, spoke with pt's husband who reports pt is currently at work. He will ask her to call office back. ______

## 2012-02-23 NOTE — Telephone Encounter (Signed)
Pt returned call.  Call her back @ [phone]. Kelly Watson

## 2012-02-23 NOTE — Telephone Encounter (Signed)
Notes Recorded by Storm Frisk, MD on 02/20/2012 at 1:08 PM Call pt and tell her all labs normal. Follow recommendations as per hematology Dr Gaylyn Rong Notes Recorded by Storm Frisk, MD on 02/19/2012 at 4:26 PM Call the pt and tell her so far all hypercoaguable labs are normal. There a few more to result and will call when available  -----  Called, lmomtcb on pt's home and cell phone #s.

## 2012-02-23 NOTE — Progress Notes (Signed)
Quick Note:  Spoke with pt. Informed her of lab results being normal per PW and advised he recs she follow recs from hematology, Dr. Gaylyn Rong. She verbalized understanding and voiced no further questions or concerns at this time. ______

## 2012-02-23 NOTE — Telephone Encounter (Signed)
Spoke with pt.  She is aware of result and recs per Dr. Delford Field.  She verbalized understanding and voiced no further questions or concerns at this time.

## 2012-02-23 NOTE — Progress Notes (Signed)
Quick Note:  lmomtcb on pt's home and cell phone #s. ______

## 2012-02-27 ENCOUNTER — Ambulatory Visit (HOSPITAL_COMMUNITY)
Admission: RE | Admit: 2012-02-27 | Discharge: 2012-02-27 | Disposition: A | Discharge status: Home / Self Care | Payer: 59 | Source: Ambulatory Visit | Attending: Oncology | Admitting: Oncology

## 2012-02-27 ENCOUNTER — Encounter (INDEPENDENT_AMBULATORY_CARE_PROVIDER_SITE_OTHER): Payer: 59

## 2012-02-27 DIAGNOSIS — Z86718 Personal history of other venous thrombosis and embolism: Secondary | ICD-10-CM

## 2012-02-27 DIAGNOSIS — M542 Cervicalgia: Secondary | ICD-10-CM | POA: Insufficient documentation

## 2012-02-27 DIAGNOSIS — I878 Other specified disorders of veins: Secondary | ICD-10-CM

## 2012-02-27 DIAGNOSIS — R609 Edema, unspecified: Secondary | ICD-10-CM

## 2012-02-27 DIAGNOSIS — I2699 Other pulmonary embolism without acute cor pulmonale: Secondary | ICD-10-CM

## 2012-02-27 DIAGNOSIS — E079 Disorder of thyroid, unspecified: Secondary | ICD-10-CM | POA: Insufficient documentation

## 2012-02-27 DIAGNOSIS — M79609 Pain in unspecified limb: Secondary | ICD-10-CM

## 2012-03-18 ENCOUNTER — Encounter (HOSPITAL_COMMUNITY): Payer: Self-pay | Admitting: Pharmacy Technician

## 2012-03-19 ENCOUNTER — Other Ambulatory Visit (HOSPITAL_COMMUNITY): Payer: 59

## 2012-03-20 ENCOUNTER — Encounter: Payer: Self-pay | Admitting: Physician Assistant

## 2012-03-20 ENCOUNTER — Other Ambulatory Visit: Payer: Self-pay | Admitting: Physician Assistant

## 2012-03-20 NOTE — H&P (Signed)
TOTAL KNEE ADMISSION H&P  Patient is being admitted for right total knee arthroplasty.  Subjective:  Chief Complaint:right knee pain.  HPI: Kelly Watson, 63 y.o. female, has a history of pain and functional disability in the right knee due to arthritis and has failed non-surgical conservative treatments for greater than 12 weeks to includeNSAID's and/or analgesics, corticosteriod injections, viscosupplementation injections and flexibility and strengthening excercises.  Onset of symptoms was gradual, starting 4 years ago with gradually worsening course since that time. The patient noted no past surgery on the right knee(s).  Patient currently rates pain in the right knee(s) at 10 out of 10 with activity. Patient has night pain, worsening of pain with activity and weight bearing, pain that interferes with activities of daily living, crepitus and joint swelling.  Patient has evidence of subchondral sclerosis, periarticular osteophytes and joint space narrowing by imaging studies.  There is no active infection.  Patient Active Problem List   Diagnosis Date Noted  . Hypercholesterolemia   . Thyroid disease   . Hypertension   . Pulmonary embolism   . Hypothyroidism    Past Medical History  Diagnosis Date  . Hypercholesterolemia   . Hypothyroidism   . Hypertension   . Personal history of pulmonary embolism 2011  . Right knee DJD     Past Surgical History  Procedure Date  . Knee surgery 2011    left total knee  Pulmonary Embolism post op  . Shoulder surgery     right rotator cuff  . Abdominal hysterectomy     for fibroid  . Thyroidectomy     partial; due to benign thyroid nodule.   . Tubal ligation      (Not in a hospital admission) Allergies  Allergen Reactions  . Aspirin Hives  . Tramadol     Over sedates    Current Outpatient Prescriptions on File Prior to Visit  Medication Sig Dispense Refill  . Bioflavonoid Products (ESTER C PO) Take 500 mg by mouth daily.      .  calcium-vitamin D (OSCAL WITH D) 500-200 MG-UNIT per tablet Take 1 tablet by mouth daily.      . ferrous sulfate 325 (65 FE) MG tablet Take 2 tablets by mouth daily.      Marland Kitchen GLUCOSAMINE PO Take 1,000 mg by mouth 2 (two) times daily.      Marland Kitchen levothyroxine (SYNTHROID, LEVOTHROID) 75 MCG tablet Take 75 mcg by mouth daily.      Marland Kitchen LORazepam (ATIVAN) 0.5 MG tablet Take 1 tablet by mouth every 8 (eight) hours as needed. anxiety      . magnesium oxide (MAG-OX) 400 MG tablet Take 800 mg by mouth daily.      . Melatonin 10 MG CAPS Take 1 tablet by mouth at bedtime.      . metoprolol succinate (TOPROL-XL) 50 MG 24 hr tablet Take 25 mg by mouth daily. Take with or immediately following a meal.      . Multiple Vitamin (MULTIVITAMIN) tablet Take 2 tablets by mouth daily.      . Omega-3 Fatty Acids (FISH OIL) 300 MG CAPS Take 1 capsule by mouth daily.      Marland Kitchen OVER THE COUNTER MEDICATION Calm forte as needed      . OVER THE COUNTER MEDICATION Restful legs as needed      . potassium gluconate (HM POTASSIUM) 595 MG TABS Take 595 mg by mouth 2 (two) times daily.      . simvastatin (ZOCOR) 40 MG  tablet Take 40 mg by mouth every evening.      . SUPER B COMPLEX/C PO Take 2 tablets by mouth daily.       . vitamin E 400 UNIT capsule Take 800 Units by mouth daily.       . VOLTAREN 1 % GEL Apply 2 g topically daily as needed. pain        History  Substance Use Topics  . Smoking status: Former Smoker -- 0.5 packs/day for 15 years    Types: Cigarettes    Quit date: 02/27/1985  . Smokeless tobacco: Never Used  . Alcohol Use: No    Family History  Problem Relation Age of Onset  . Asthma Daughter   . Breast cancer Mother   . Breast cancer Maternal Grandmother   . Cancer Brother     prostate     Review of Systems  Constitutional: Negative.   HENT: Negative.   Eyes: Negative.   Respiratory: Negative.   Cardiovascular: Negative.   Gastrointestinal: Negative.   Genitourinary: Negative.   Musculoskeletal:  Positive for joint pain.  Skin: Negative.   Neurological: Negative.   Endo/Heme/Allergies: Negative.   Psychiatric/Behavioral: Negative.     Objective:  Physical Exam  Constitutional: She is oriented to person, place, and time. She appears well-developed and well-nourished.  HENT:  Head: Normocephalic and atraumatic.  Eyes: Conjunctivae normal and EOM are normal. Pupils are equal, round, and reactive to light.  Neck: Neck supple.  Cardiovascular: Regular rhythm.   Respiratory: Effort normal and breath sounds normal.  GI: Soft. Bowel sounds are normal.  Genitourinary:       Not pertinent to current symptomatology therefore not examined.  Musculoskeletal:       Respirations are 12 and unlabored. Examination of her right knee reveals 1 to 2+ crepitation 1+ synovitis diffuse pain, range of motion -5 to 120 degrees knee is stable with normal patella tracking. Exam of the left knee reveals well healed total knee incision without swelling or pain range of motion 0-120 degrees knee is stable with normal patella tracking. Vascular exam: pulses 2+ and symmetric.  Neurological: She is alert and oriented to person, place, and time.  Skin: Skin is warm and dry.  Psychiatric: She has a normal mood and affect. Her behavior is normal. Thought content normal.    Vital signs in last 24 hours: Last recorded: 01/22 1500   BP: 159/82 Pulse: 70  Temp: 98.5 F (36.9 C)    Height: 5\' 10"  (1.778 m) SpO2: 99  Weight: 107.502 kg (237 lb)     Labs:   Estimated Body mass index is 34.01 kg/(m^2) as calculated from the following:   Height as of this encounter: 5\' 10" (1.778 m).   Weight as of this encounter: 237 lb(107.502 kg).   Imaging Review Plain radiographs demonstrate severe degenerative joint disease of the right knee(s). The overall alignment ismild valgus. The bone quality appears to be good for age and reported activity level.  Assessment/Plan:  End stage arthritis, right knee  Patient  Active Problem List  Diagnosis  . Hypercholesterolemia  . Thyroid disease  . Hypertension  . Pulmonary embolism  . Hypothyroidism   The patient history, physical examination, clinical judgment of the provider and imaging studies are consistent with end stage degenerative joint disease of the right knee(s) and total knee arthroplasty is deemed medically necessary. The treatment options including medical management, injection therapy arthroscopy and arthroplasty were discussed at length. The risks and benefits of total knee  arthroplasty were presented and reviewed. The risks due to aseptic loosening, infection, stiffness, patella tracking problems, thromboembolic complications and other imponderables were discussed. The patient acknowledged the explanation, agreed to proceed with the plan and consent was signed. Patient is being admitted for inpatient treatment for surgery, pain control, PT, OT, prophylactic antibiotics, VTE prophylaxis, progressive ambulation and ADL's and discharge planning. The patient is planning to be discharged home with home health services   A. Gwinda Passe Physician Assistant [facility] [phone]  03/20/2012, 3:48 PM

## 2012-03-26 ENCOUNTER — Encounter (HOSPITAL_COMMUNITY)
Admission: RE | Admit: 2012-03-26 | Discharge: 2012-03-26 | Disposition: A | Discharge status: Home / Self Care | Payer: 59 | Source: Ambulatory Visit | Attending: Orthopedic Surgery | Admitting: Orthopedic Surgery

## 2012-03-26 ENCOUNTER — Encounter (HOSPITAL_COMMUNITY): Payer: Self-pay

## 2012-03-26 ENCOUNTER — Encounter (HOSPITAL_COMMUNITY)
Admission: RE | Admit: 2012-03-26 | Discharge: 2012-03-26 | Disposition: A | Discharge status: Home / Self Care | Payer: 59 | Source: Ambulatory Visit | Attending: Physician Assistant | Admitting: Physician Assistant

## 2012-03-26 LAB — CBC WITH DIFFERENTIAL/PLATELET
Eosinophils Absolute: 0.2 10*3/uL (ref 0.0–0.7)
Eosinophils Relative: 3 % (ref 0–5)
Hemoglobin: 13.4 g/dL (ref 12.0–15.0)
Lymphocytes Relative: 43 % (ref 12–46)
Lymphs Abs: 3 10*3/uL (ref 0.7–4.0)
MCH: 32.5 pg (ref 26.0–34.0)
Monocytes Absolute: 0.6 10*3/uL (ref 0.1–1.0)
Platelets: 232 10*3/uL (ref 150–400)
RDW: 13.1 % (ref 11.5–15.5)
WBC: 7 10*3/uL (ref 4.0–10.5)

## 2012-03-26 LAB — COMPREHENSIVE METABOLIC PANEL
ALT: 29 U/L (ref 0–35)
CO2: 27 mEq/L (ref 19–32)
Calcium: 9.5 mg/dL (ref 8.4–10.5)
Creatinine, Ser: 0.82 mg/dL (ref 0.50–1.10)
GFR calc Af Amer: 87 mL/min — ABNORMAL LOW (ref >90)
GFR calc non Af Amer: 75 mL/min — ABNORMAL LOW (ref >90)
Glucose, Bld: 85 mg/dL (ref 70–99)
Sodium: 141 mEq/L (ref 135–145)
Total Protein: 7.6 g/dL (ref 6.0–8.3)

## 2012-03-26 LAB — URINALYSIS, ROUTINE W REFLEX MICROSCOPIC
Bilirubin Urine: NEGATIVE
Glucose, UA: NEGATIVE mg/dL
Hgb urine dipstick: NEGATIVE
Ketones, ur: NEGATIVE mg/dL
Protein, ur: NEGATIVE mg/dL
Urobilinogen, UA: 0.2 mg/dL (ref 0.0–1.0)

## 2012-03-26 LAB — SURGICAL PCR SCREEN: MRSA, PCR: NEGATIVE

## 2012-03-26 LAB — PROTIME-INR
INR: 1.03 (ref 0.00–1.49)
Prothrombin Time: 13.4 seconds (ref 11.6–15.2)

## 2012-03-26 LAB — TYPE AND SCREEN
ABO/RH(D): A POS
Antibody Screen: NEGATIVE

## 2012-03-26 LAB — APTT: aPTT: 34 seconds (ref 24–37)

## 2012-03-26 MED ORDER — CHLORHEXIDINE GLUCONATE 4 % EX LIQD: 60.0000 mL | Freq: Once | CUTANEOUS | Status: DC

## 2012-03-26 NOTE — Pre-Procedure Instructions (Signed)
Kelly Watson  03/26/2012   Your procedure is scheduled on:  Monday April 01, 2012  Report to [facility] at 0530 AM.  Call this number if you have problems the morning of surgery: [phone]   Remember:   Do not eat food or drink liquids after midnight.   Take these medicines the morning of surgery with A SIP OF WATER: Levothroid, Ativan, and Metoprolol,   Do not wear jewelry, make-up or nail polish.  Do not wear lotions, powders, or perfumes. You may wear deodorant.  Do not shave 48 hours prior to surgery.   Do not bring valuables to the hospital.  Contacts, dentures or bridgework may not be worn into surgery.  Leave suitcase in the car. After surgery it may be brought to your room.  For patients admitted to the hospital, checkout time is 11:00 AM the day of  discharge.   Patients discharged the day of surgery will not be allowed to drive  home.    Special Instructions: Incentive Spirometry - Practice and bring it with you on the day of surgery. Shower using CHG 2 nights before surgery and the night before surgery.  If you shower the day of surgery use CHG.  Use special wash - you have one bottle of CHG for all showers.  You should use approximately 1/3 of the bottle for each shower.   Please read over the following fact sheets that you were given: Pain Booklet, Coughing and Deep Breathing, Blood Transfusion Information, MRSA Information and Surgical Site Infection Prevention

## 2012-03-27 LAB — URINE CULTURE
Colony Count: NO GROWTH
Culture: NO GROWTH

## 2012-03-27 NOTE — Consult Note (Signed)
Anesthesia Chart Review:  Patient is a 62 year old female scheduled for right TKR by Dr. Thurston Hole on 04/01/12.  History includes HTN, thyroidectomy with secondary hypothyroidism, post-op PE '11, former smoker.  PCP is Dr. Sigmund Hazel, who cleared patient for this procedure. By notes, she will be on Lovenox for 12 weeks post-operatively given history of PE. Patient was also cleared by Hematologist Dr. Gaylyn Rong and Pulmonologist Dr. Delford Field (see their notes in Epic).  EKG on 03/11/12 (PCP) showed NSR.  Preoperative CXR and labs noted.  Plan to proceed.  Shonna Chock, PA-C 03/27/12 1758

## 2012-03-31 MED ORDER — DEXTROSE 5 % IV SOLN
3.0000 g | INTRAVENOUS | Status: AC
  Administered 2012-04-01: 3 g via INTRAVENOUS
  Filled 2012-03-31: qty 3000

## 2012-04-01 ENCOUNTER — Encounter (HOSPITAL_COMMUNITY): Payer: Self-pay | Admitting: *Deleted

## 2012-04-01 ENCOUNTER — Encounter (HOSPITAL_COMMUNITY)
Admission: RE | Disposition: A | Discharge status: Home Health | Payer: Self-pay | Source: Ambulatory Visit | Attending: Orthopedic Surgery

## 2012-04-01 ENCOUNTER — Inpatient Hospital Stay (HOSPITAL_COMMUNITY)
Admission: RE | Admit: 2012-04-01 | Discharge: 2012-04-02 | DRG: 470 | Disposition: A | Discharge status: Home Health | Payer: 59 | Source: Ambulatory Visit | Attending: Orthopedic Surgery | Admitting: Orthopedic Surgery

## 2012-04-01 ENCOUNTER — Encounter (HOSPITAL_COMMUNITY): Payer: Self-pay | Admitting: Vascular Surgery

## 2012-04-01 ENCOUNTER — Ambulatory Visit (HOSPITAL_COMMUNITY): Payer: 59 | Admitting: Vascular Surgery

## 2012-04-01 DIAGNOSIS — M1711 Unilateral primary osteoarthritis, right knee: Secondary | ICD-10-CM | POA: Diagnosis present

## 2012-04-01 DIAGNOSIS — Z86711 Personal history of pulmonary embolism: Secondary | ICD-10-CM

## 2012-04-01 DIAGNOSIS — Z79899 Other long term (current) drug therapy: Secondary | ICD-10-CM

## 2012-04-01 DIAGNOSIS — Z01812 Encounter for preprocedural laboratory examination: Secondary | ICD-10-CM

## 2012-04-01 DIAGNOSIS — Z96659 Presence of unspecified artificial knee joint: Secondary | ICD-10-CM

## 2012-04-01 DIAGNOSIS — E079 Disorder of thyroid, unspecified: Secondary | ICD-10-CM | POA: Diagnosis present

## 2012-04-01 DIAGNOSIS — Z01818 Encounter for other preprocedural examination: Secondary | ICD-10-CM

## 2012-04-01 DIAGNOSIS — E7800 Pure hypercholesterolemia, unspecified: Secondary | ICD-10-CM | POA: Diagnosis present

## 2012-04-01 DIAGNOSIS — M1710 Unilateral primary osteoarthritis, unspecified knee: Principal | ICD-10-CM | POA: Diagnosis present

## 2012-04-01 DIAGNOSIS — E039 Hypothyroidism, unspecified: Secondary | ICD-10-CM | POA: Diagnosis present

## 2012-04-01 DIAGNOSIS — IMO0002 Reserved for concepts with insufficient information to code with codable children: Principal | ICD-10-CM | POA: Diagnosis present

## 2012-04-01 DIAGNOSIS — I2699 Other pulmonary embolism without acute cor pulmonale: Secondary | ICD-10-CM | POA: Diagnosis present

## 2012-04-01 DIAGNOSIS — I10 Essential (primary) hypertension: Secondary | ICD-10-CM | POA: Diagnosis present

## 2012-04-01 HISTORY — DX: Anxiety disorder, unspecified: F41.9

## 2012-04-01 HISTORY — PX: TOTAL KNEE ARTHROPLASTY: SHX125

## 2012-04-01 LAB — CBC
HCT: 37.3 % (ref 36.0–46.0)
Platelets: 205 10*3/uL (ref 150–400)
RDW: 12.9 % (ref 11.5–15.5)
WBC: 14.4 10*3/uL — ABNORMAL HIGH (ref 4.0–10.5)

## 2012-04-01 LAB — CREATININE, SERUM
GFR calc Af Amer: >90 mL/min (ref >90)
GFR calc non Af Amer: 88 mL/min — ABNORMAL LOW (ref >90)

## 2012-04-01 SURGERY — ARTHROPLASTY, KNEE, TOTAL
Anesthesia: Regional | Site: Knee | Laterality: Right | Wound class: Clean

## 2012-04-01 MED ORDER — GLYCOPYRROLATE 0.2 MG/ML IJ SOLN
INTRAMUSCULAR | Status: DC | PRN
  Administered 2012-04-01: .6 mg via INTRAVENOUS

## 2012-04-01 MED ORDER — ACETAMINOPHEN 650 MG RE SUPP: 650.0000 mg | Freq: Four times a day (QID) | RECTAL | Status: DC | PRN

## 2012-04-01 MED ORDER — ONDANSETRON HCL 4 MG/2ML IJ SOLN: 4.0000 mg | Freq: Four times a day (QID) | INTRAMUSCULAR | Status: DC | PRN

## 2012-04-01 MED ORDER — BUPIVACAINE-EPINEPHRINE PF 0.5-1:200000 % IJ SOLN
INTRAMUSCULAR | Status: DC | PRN
  Administered 2012-04-01: 30 mL

## 2012-04-01 MED ORDER — MENTHOL 3 MG MT LOZG: 1.0000 | LOZENGE | OROMUCOSAL | Status: DC | PRN

## 2012-04-01 MED ORDER — ACETAMINOPHEN 325 MG PO TABS: 650.0000 mg | ORAL_TABLET | Freq: Four times a day (QID) | ORAL | Status: DC | PRN

## 2012-04-01 MED ORDER — ZOLPIDEM TARTRATE 5 MG PO TABS: 5.0000 mg | ORAL_TABLET | Freq: Every evening | ORAL | Status: DC | PRN

## 2012-04-01 MED ORDER — POTASSIUM CHLORIDE IN NACL 20-0.9 MEQ/L-% IV SOLN
INTRAVENOUS | Status: DC
  Administered 2012-04-01: 13:00:00 via INTRAVENOUS
  Filled 2012-04-01 (×4): qty 1000

## 2012-04-01 MED ORDER — LEVOTHYROXINE SODIUM 75 MCG PO TABS
75.0000 ug | ORAL_TABLET | Freq: Every day | ORAL | Status: DC
  Administered 2012-04-02: 75 ug via ORAL
  Filled 2012-04-01 (×3): qty 1

## 2012-04-01 MED ORDER — ARTIFICIAL TEARS OP OINT
TOPICAL_OINTMENT | OPHTHALMIC | Status: DC | PRN
  Administered 2012-04-01: 1 via OPHTHALMIC

## 2012-04-01 MED ORDER — FENTANYL CITRATE 0.05 MG/ML IJ SOLN
25.0000 ug | INTRAMUSCULAR | Status: DC | PRN
  Administered 2012-04-01 (×3): 50 ug via INTRAVENOUS

## 2012-04-01 MED ORDER — METOPROLOL SUCCINATE ER 25 MG PO TB24
25.0000 mg | ORAL_TABLET | Freq: Every day | ORAL | Status: DC
  Administered 2012-04-01 – 2012-04-02 (×2): 25 mg via ORAL
  Filled 2012-04-01 (×2): qty 1

## 2012-04-01 MED ORDER — ALUM & MAG HYDROXIDE-SIMETH 200-200-20 MG/5ML PO SUSP: 30.0000 mL | ORAL | Status: DC | PRN

## 2012-04-01 MED ORDER — POVIDONE-IODINE 7.5 % EX SOLN
Freq: Once | CUTANEOUS | Status: DC
  Filled 2012-04-01: qty 118

## 2012-04-01 MED ORDER — SIMVASTATIN 40 MG PO TABS
40.0000 mg | ORAL_TABLET | Freq: Every evening | ORAL | Status: DC
  Administered 2012-04-01: 40 mg via ORAL
  Filled 2012-04-01 (×2): qty 1

## 2012-04-01 MED ORDER — OXYCODONE HCL 5 MG PO TABS
5.0000 mg | ORAL_TABLET | ORAL | Status: DC | PRN
  Administered 2012-04-01 – 2012-04-02 (×5): 10 mg via ORAL
  Filled 2012-04-01 (×6): qty 2

## 2012-04-01 MED ORDER — ACETAMINOPHEN 10 MG/ML IV SOLN
1000.0000 mg | Freq: Four times a day (QID) | INTRAVENOUS | Status: AC
  Administered 2012-04-01 – 2012-04-02 (×4): 1000 mg via INTRAVENOUS
  Filled 2012-04-01 (×4): qty 100

## 2012-04-01 MED ORDER — ROCURONIUM BROMIDE 100 MG/10ML IV SOLN
INTRAVENOUS | Status: DC | PRN
  Administered 2012-04-01: 50 mg via INTRAVENOUS

## 2012-04-01 MED ORDER — BISACODYL 5 MG PO TBEC
10.0000 mg | DELAYED_RELEASE_TABLET | Freq: Every day | ORAL | Status: DC
  Administered 2012-04-01: 10 mg via ORAL
  Filled 2012-04-01: qty 2

## 2012-04-01 MED ORDER — CEFUROXIME SODIUM 1.5 G IJ SOLR
INTRAMUSCULAR | Status: DC | PRN
  Administered 2012-04-01: 1.5 g

## 2012-04-01 MED ORDER — LIDOCAINE HCL (CARDIAC) 20 MG/ML IV SOLN
INTRAVENOUS | Status: DC | PRN
  Administered 2012-04-01: 80 mg via INTRAVENOUS

## 2012-04-01 MED ORDER — DOCUSATE SODIUM 100 MG PO CAPS
100.0000 mg | ORAL_CAPSULE | Freq: Two times a day (BID) | ORAL | Status: DC
  Administered 2012-04-01 – 2012-04-02 (×2): 100 mg via ORAL
  Filled 2012-04-01 (×3): qty 1

## 2012-04-01 MED ORDER — PHENOL 1.4 % MT LIQD: 1.0000 | OROMUCOSAL | Status: DC | PRN

## 2012-04-01 MED ORDER — NEOSTIGMINE METHYLSULFATE 1 MG/ML IJ SOLN
INTRAMUSCULAR | Status: DC | PRN
  Administered 2012-04-01: 4 mg via INTRAVENOUS

## 2012-04-01 MED ORDER — METOCLOPRAMIDE HCL 5 MG/ML IJ SOLN: 5.0000 mg | Freq: Three times a day (TID) | INTRAMUSCULAR | Status: DC | PRN

## 2012-04-01 MED ORDER — BUPIVACAINE-EPINEPHRINE 0.25% -1:200000 IJ SOLN
INTRAMUSCULAR | Status: DC | PRN
  Administered 2012-04-01: 30 mL

## 2012-04-01 MED ORDER — DIPHENHYDRAMINE HCL 12.5 MG/5ML PO ELIX: 12.5000 mg | ORAL_SOLUTION | ORAL | Status: DC | PRN

## 2012-04-01 MED ORDER — LORAZEPAM 0.5 MG PO TABS: 0.5000 mg | ORAL_TABLET | Freq: Four times a day (QID) | ORAL | Status: DC | PRN

## 2012-04-01 MED ORDER — METOCLOPRAMIDE HCL 10 MG PO TABS: 5.0000 mg | ORAL_TABLET | Freq: Three times a day (TID) | ORAL | Status: DC | PRN

## 2012-04-01 MED ORDER — DEXAMETHASONE SODIUM PHOSPHATE 10 MG/ML IJ SOLN
10.0000 mg | Freq: Three times a day (TID) | INTRAMUSCULAR | Status: AC
  Filled 2012-04-01 (×3): qty 1

## 2012-04-01 MED ORDER — ONDANSETRON HCL 4 MG PO TABS: 4.0000 mg | ORAL_TABLET | Freq: Four times a day (QID) | ORAL | Status: DC | PRN

## 2012-04-01 MED ORDER — PROPOFOL 10 MG/ML IV BOLUS
INTRAVENOUS | Status: DC | PRN
  Administered 2012-04-01: 170 mg via INTRAVENOUS

## 2012-04-01 MED ORDER — LACTATED RINGERS IV SOLN
INTRAVENOUS | Status: DC | PRN
  Administered 2012-04-01: 07:00:00 via INTRAVENOUS

## 2012-04-01 MED ORDER — CALCIUM CARBONATE-VITAMIN D 500-200 MG-UNIT PO TABS
1.0000 | ORAL_TABLET | Freq: Every day | ORAL | Status: DC
  Administered 2012-04-01 – 2012-04-02 (×2): 1 via ORAL
  Filled 2012-04-01 (×2): qty 1

## 2012-04-01 MED ORDER — SODIUM CHLORIDE 0.9 % IR SOLN
Status: DC | PRN
  Administered 2012-04-01: 3000 mL

## 2012-04-01 MED ORDER — FENTANYL CITRATE 0.05 MG/ML IJ SOLN
INTRAMUSCULAR | Status: DC | PRN
  Administered 2012-04-01 (×5): 50 ug via INTRAVENOUS

## 2012-04-01 MED ORDER — ENOXAPARIN SODIUM 30 MG/0.3ML ~~LOC~~ SOLN
30.0000 mg | Freq: Two times a day (BID) | SUBCUTANEOUS | Status: DC
  Administered 2012-04-02: 30 mg via SUBCUTANEOUS
  Filled 2012-04-01 (×3): qty 0.3

## 2012-04-01 MED ORDER — ONDANSETRON HCL 4 MG/2ML IJ SOLN
INTRAMUSCULAR | Status: DC | PRN
  Administered 2012-04-01: 4 mg via INTRAVENOUS

## 2012-04-01 MED ORDER — CEFUROXIME SODIUM 1.5 G IJ SOLR
INTRAMUSCULAR | Status: AC
  Filled 2012-04-01: qty 1.5

## 2012-04-01 MED ORDER — HYDROMORPHONE HCL PF 1 MG/ML IJ SOLN
0.5000 mg | INTRAMUSCULAR | Status: DC | PRN
  Administered 2012-04-01: 0.5 mg via INTRAVENOUS
  Filled 2012-04-01: qty 1

## 2012-04-01 MED ORDER — DEXAMETHASONE SODIUM PHOSPHATE 10 MG/ML IJ SOLN
INTRAMUSCULAR | Status: DC | PRN
  Administered 2012-04-01: 10 mg via INTRAVENOUS

## 2012-04-01 MED ORDER — FENTANYL CITRATE 0.05 MG/ML IJ SOLN
INTRAMUSCULAR | Status: AC
  Filled 2012-04-01: qty 4

## 2012-04-01 MED ORDER — MAGNESIUM OXIDE 400 MG PO TABS
800.0000 mg | ORAL_TABLET | Freq: Every day | ORAL | Status: DC
  Administered 2012-04-02: 800 mg via ORAL
  Filled 2012-04-01 (×2): qty 2

## 2012-04-01 MED ORDER — ADULT MULTIVITAMIN W/MINERALS CH
1.0000 | ORAL_TABLET | Freq: Every day | ORAL | Status: DC
  Administered 2012-04-02: 1 via ORAL
  Filled 2012-04-01 (×2): qty 1

## 2012-04-01 MED ORDER — CHLORHEXIDINE GLUCONATE 4 % EX LIQD: 60.0000 mL | Freq: Once | CUTANEOUS | Status: DC

## 2012-04-01 MED ORDER — MIDAZOLAM HCL 5 MG/5ML IJ SOLN
INTRAMUSCULAR | Status: DC | PRN
  Administered 2012-04-01: 2 mg via INTRAVENOUS

## 2012-04-01 MED ORDER — LACTATED RINGERS IV SOLN: INTRAVENOUS | Status: DC

## 2012-04-01 MED ORDER — DEXAMETHASONE 4 MG PO TABS
10.0000 mg | ORAL_TABLET | Freq: Three times a day (TID) | ORAL | Status: AC
  Administered 2012-04-01 – 2012-04-02 (×3): 10 mg via ORAL
  Filled 2012-04-01 (×3): qty 1

## 2012-04-01 SURGICAL SUPPLY — 67 items
BANDAGE ESMARK 6X9 LF (GAUZE/BANDAGES/DRESSINGS) ×1 IMPLANT
BLADE SAGITTAL 25.0X1.19X90 (BLADE) ×2 IMPLANT
BLADE SAW SGTL 11.0X1.19X90.0M (BLADE) IMPLANT
BLADE SAW SGTL 13.0X1.19X90.0M (BLADE) ×2 IMPLANT
BLADE SURG 10 STRL SS (BLADE) ×4 IMPLANT
BNDG CMPR 9X6 STRL LF SNTH (GAUZE/BANDAGES/DRESSINGS) ×1
BNDG CMPR MED 15X6 ELC VLCR LF (GAUZE/BANDAGES/DRESSINGS) ×1
BNDG ELASTIC 6X15 VLCR STRL LF (GAUZE/BANDAGES/DRESSINGS) ×2 IMPLANT
BNDG ESMARK 6X9 LF (GAUZE/BANDAGES/DRESSINGS) ×2
BOWL SMART MIX CTS (DISPOSABLE) ×2 IMPLANT
CEMENT HV SMART SET (Cement) ×4 IMPLANT
CLOTH BEACON ORANGE TIMEOUT ST (SAFETY) ×2 IMPLANT
COVER BACK TABLE 24X17X13 BIG (DRAPES) IMPLANT
COVER SURGICAL LIGHT HANDLE (MISCELLANEOUS) ×2 IMPLANT
CUFF TOURNIQUET SINGLE 34IN LL (TOURNIQUET CUFF) ×1 IMPLANT
CUFF TOURNIQUET SINGLE 44IN (TOURNIQUET CUFF) ×1 IMPLANT
DRAPE EXTREMITY T 121X128X90 (DRAPE) ×2 IMPLANT
DRAPE INCISE IOBAN 66X45 STRL (DRAPES) ×1 IMPLANT
DRAPE PROXIMA HALF (DRAPES) ×2 IMPLANT
DRAPE U-SHAPE 47X51 STRL (DRAPES) ×2 IMPLANT
DRSG ADAPTIC 3X8 NADH LF (GAUZE/BANDAGES/DRESSINGS) ×2 IMPLANT
DRSG PAD ABDOMINAL 8X10 ST (GAUZE/BANDAGES/DRESSINGS) ×4 IMPLANT
DURAPREP 26ML APPLICATOR (WOUND CARE) ×2 IMPLANT
ELECT CAUTERY BLADE 6.4 (BLADE) ×2 IMPLANT
ELECT REM PT RETURN 9FT ADLT (ELECTROSURGICAL) ×2
ELECTRODE REM PT RTRN 9FT ADLT (ELECTROSURGICAL) ×1 IMPLANT
EVACUATOR 1/8 PVC DRAIN (DRAIN) ×1 IMPLANT
FACESHIELD LNG OPTICON STERILE (SAFETY) ×2 IMPLANT
GLOVE BIO SURGEON STRL SZ7 (GLOVE) ×2 IMPLANT
GLOVE BIOGEL PI IND STRL 7.0 (GLOVE) ×1 IMPLANT
GLOVE BIOGEL PI IND STRL 7.5 (GLOVE) ×1 IMPLANT
GLOVE BIOGEL PI INDICATOR 7.0 (GLOVE) ×1
GLOVE BIOGEL PI INDICATOR 7.5 (GLOVE) ×1
GLOVE SS BIOGEL STRL SZ 7.5 (GLOVE) ×1 IMPLANT
GLOVE SUPERSENSE BIOGEL SZ 7.5 (GLOVE) ×1
GOWN PREVENTION PLUS XLARGE (GOWN DISPOSABLE) ×4 IMPLANT
GOWN STRL NON-REIN LRG LVL3 (GOWN DISPOSABLE) ×4 IMPLANT
HANDPIECE INTERPULSE COAX TIP (DISPOSABLE) ×2
HOOD PEEL AWAY FACE SHEILD DIS (HOOD) ×5 IMPLANT
IMMOBILIZER KNEE 22 UNIV (SOFTGOODS) IMPLANT
INSERT CUSHION PRONEVIEW LG (MISCELLANEOUS) ×2 IMPLANT
KIT BASIN OR (CUSTOM PROCEDURE TRAY) ×2 IMPLANT
KIT ROOM TURNOVER OR (KITS) ×2 IMPLANT
MANIFOLD NEPTUNE II (INSTRUMENTS) ×2 IMPLANT
NS IRRIG 1000ML POUR BTL (IV SOLUTION) ×2 IMPLANT
PACK TOTAL JOINT (CUSTOM PROCEDURE TRAY) ×2 IMPLANT
PAD ARMBOARD 7.5X6 YLW CONV (MISCELLANEOUS) ×4 IMPLANT
PAD CAST 4YDX4 CTTN HI CHSV (CAST SUPPLIES) ×1 IMPLANT
PADDING CAST COTTON 4X4 STRL (CAST SUPPLIES) ×2
PADDING CAST COTTON 6X4 STRL (CAST SUPPLIES) ×2 IMPLANT
POSITIONER HEAD PRONE TRACH (MISCELLANEOUS) ×2 IMPLANT
RUBBERBAND STERILE (MISCELLANEOUS) ×2 IMPLANT
SET HNDPC FAN SPRY TIP SCT (DISPOSABLE) ×1 IMPLANT
SPONGE GAUZE 4X4 12PLY (GAUZE/BANDAGES/DRESSINGS) ×2 IMPLANT
STRIP CLOSURE SKIN 1/2X4 (GAUZE/BANDAGES/DRESSINGS) ×2 IMPLANT
SUCTION FRAZIER TIP 10 FR DISP (SUCTIONS) ×2 IMPLANT
SUT ETHIBOND NAB CT1 #1 30IN (SUTURE) ×3 IMPLANT
SUT MNCRL AB 3-0 PS2 18 (SUTURE) ×2 IMPLANT
SUT VIC AB 0 CT1 27 (SUTURE) ×4
SUT VIC AB 0 CT1 27XBRD ANBCTR (SUTURE) ×2 IMPLANT
SUT VIC AB 2-0 CT1 27 (SUTURE) ×4
SUT VIC AB 2-0 CT1 TAPERPNT 27 (SUTURE) ×2 IMPLANT
SYR 30ML SLIP (SYRINGE) ×2 IMPLANT
TOWEL OR 17X24 6PK STRL BLUE (TOWEL DISPOSABLE) ×2 IMPLANT
TOWEL OR 17X26 10 PK STRL BLUE (TOWEL DISPOSABLE) ×2 IMPLANT
TRAY FOLEY CATH 14FR (SET/KITS/TRAYS/PACK) ×2 IMPLANT
WATER STERILE IRR 1000ML POUR (IV SOLUTION) ×6 IMPLANT

## 2012-04-01 NOTE — Transfer of Care (Signed)
Immediate Anesthesia Transfer of Care Note  Patient: Kelly Watson  Procedure(s) Performed: Procedure(s) (LRB) with comments: TOTAL KNEE ARTHROPLASTY (Right)  Patient Location: PACU  Anesthesia Type:General and Regional  Level of Consciousness: awake, alert , oriented and sedated  Airway & Oxygen Therapy: Patient Spontanous Breathing and Patient connected to nasal cannula oxygen  Post-op Assessment: Report given to PACU RN, Post -op Vital signs reviewed and stable and Patient moving all extremities X 4  Post vital signs: Reviewed and stable  Complications: No apparent anesthesia complications

## 2012-04-01 NOTE — Progress Notes (Signed)
Orthopedic Tech Progress Note Patient Details:  Kelly Watson 1951-02-10 [id number]  CPM Right Knee CPM Right Knee: On Right Knee Flexion (Degrees): 60  Right Knee Extension (Degrees): 0  Additional Comments: trapeze bar   Cammer, Mickie Bail 04/01/2012, 10:28 AM

## 2012-04-01 NOTE — Plan of Care (Signed)
Problem: Consults Goal: Diagnosis- Total Joint Replacement Primary Total Knee RIght     

## 2012-04-01 NOTE — Interval H&P Note (Signed)
History and Physical Interval Note:  04/01/2012 7:02 AM  Kelly Watson  has presented today for surgery, with the diagnosis of DJD RIGHT KNEE  The various methods of treatment have been discussed with the patient and family. After consideration of risks, benefits and other options for treatment, the patient has consented to  Procedure(s) (LRB) with comments: TOTAL KNEE ARTHROPLASTY (Right) as a surgical intervention .  The patient's history has been reviewed, patient examined, no change in status, stable for surgery.  I have reviewed the patient's chart and labs.  Questions were answered to the patient's satisfaction.     Salvatore Marvel A

## 2012-04-01 NOTE — Evaluation (Signed)
Physical Therapy Evaluation Patient Details Name: Kelly Watson MRN: [id number] DOB: 27-Aug-1950 Today's Date: 04/01/2012 Time: 1308-6578 PT Time Calculation (min): 30 min  PT Assessment / Plan / Recommendation Clinical Impression  Pt admitted s/p R TKA and progressing well with mobility. Pt required assist for basic transfers but ambulating well with use of KI. Pt educated for heel roll, HEP and progression. Pt became lightheaded end of ambulation and required chair pulled to her but was not syncopal and conversational throughout. Pt encouraged to continue HEP and educated for transfer back to bed with nursing as well as additonal time in CPM today. Pt will benefit from acute therapy to maximize transfers, strength, gait and function prior to discharge to decrease burden of care and increase independence.     PT Assessment  Patient needs continued PT services    Follow Up Recommendations  Home health PT    Does the patient have the potential to tolerate intense rehabilitation      Barriers to Discharge None      Equipment Recommendations  None recommended by PT    Recommendations for Other Services     Frequency 7X/week    Precautions / Restrictions Precautions Precautions: Knee Required Braces or Orthoses: Knee Immobilizer - Right Knee Immobilizer - Right: On when out of bed or walking;Discontinue once straight leg raise with < 10 degree lag Restrictions Weight Bearing Restrictions: Yes RLE Weight Bearing: Weight bearing as tolerated   Pertinent Vitals/Pain No pain sats 98% on RA      Mobility  Bed Mobility Bed Mobility: Supine to Sit Supine to Sit: HOB elevated;With rails;4: Min assist Details for Bed Mobility Assistance: min assist with cueing for scooting pelvis to EOB and pivoting to side of bed, HOB 20degrees with use of rail and min assist for RLE, pt attempting to assist with LLE but developed cramp in LLE and required assist to  complete Transfers Transfers: Sit to Stand;Stand to Sit Sit to Stand: 4: Min assist;From bed Stand to Sit: 4: Min assist;To chair/3-in-1 Details for Transfer Assistance: cueing for hand placement and position of RLE with transfers secondary to KI and lack of flexion Ambulation/Gait Ambulation/Gait Assistance: 4: Min guard Ambulation Distance (Feet): 60 Feet Assistive device: Rolling walker Ambulation/Gait Assistance Details: cueing for sequence and position in RW Gait Pattern: Step-to pattern;Decreased stance time - right Gait velocity: decreased Stairs: No    Exercises Total Joint Exercises Quad Sets: AROM;5 reps;Right;Supine Heel Slides: AAROM;5 reps;Right;Supine   PT Diagnosis: Difficulty walking  PT Problem List: Decreased strength;Decreased range of motion;Decreased activity tolerance;Decreased mobility;Decreased knowledge of use of DME PT Treatment Interventions: DME instruction;Gait training;Stair training;Functional mobility training;Therapeutic activities;Therapeutic exercise;Patient/family education   PT Goals Acute Rehab PT Goals PT Goal Formulation: With patient/family Time For Goal Achievement: 04/08/12 Potential to Achieve Goals: Good Pt will go Supine/Side to Sit: with modified independence;with HOB 0 degrees PT Goal: Supine/Side to Sit - Progress: Goal set today Pt will go Sit to Supine/Side: with modified independence;with HOB 0 degrees PT Goal: Sit to Supine/Side - Progress: Goal set today Pt will go Sit to Stand: with modified independence PT Goal: Sit to Stand - Progress: Goal set today Pt will go Stand to Sit: with modified independence PT Goal: Stand to Sit - Progress: Goal set today Pt will Ambulate: >150 feet;with modified independence;with least restrictive assistive device PT Goal: Ambulate - Progress: Goal set today Pt will Go Up / Down Stairs: 3-5 stairs;with min assist;with least restrictive assistive device PT Goal: Up/Down  Stairs - Progress: Goal  set today Pt will Perform Home Exercise Program: with supervision, verbal cues required/provided PT Goal: Perform Home Exercise Program - Progress: Goal set today  Visit Information  Last PT Received On: 04/01/12 Assistance Needed: +1    Subjective Data  Subjective: My husband will be with me Patient Stated Goal: return home but not back to work too soon   Prior Functioning  Home Living Lives With: Spouse Available Help at Discharge: Family;Available 24 hours/day Type of Home: House Home Access: Stairs to enter [employer] of Steps: 3 Entrance Stairs-Rails: None Home Layout: Two level;Able to live on main level with bedroom/bathroom;Laundry or work area in basement [employer] Shower/Tub: Engineer, manufacturing systems: [employer] Equipment: Bedside commode/3-in-1;Walker - rolling;Tub transfer bench Prior Function Level of Independence: Independent Able to Take Stairs?: Yes Driving: Yes Vocation: Full time employment Communication Communication: No difficulties    Cognition  Cognition Overall Cognitive Status: Appears within functional limits for tasks assessed/performed Arousal/Alertness: Awake/alert Orientation Level: Appears intact for tasks assessed Behavior During Session: [facility] for tasks performed    Extremity/Trunk Assessment Right Upper Extremity Assessment RUE ROM/Strength/Tone: Within functional levels Left Upper Extremity Assessment LUE ROM/Strength/Tone: Within functional levels Right Lower Extremity Assessment RLE ROM/Strength/Tone: Deficits RLE ROM/Strength/Tone Deficits: decreased strength post op Left Lower Extremity Assessment LLE ROM/Strength/Tone: Within functional levels LLE Sensation: WFL - Light Touch Trunk Assessment Trunk Assessment: Normal   Balance    End of Session PT - End of Session Equipment Utilized During Treatment: Gait belt;Right knee immobilizer Activity Tolerance: Patient tolerated treatment well Patient  left: in chair;with call bell/phone within reach;with family/visitor present Nurse Communication: Mobility status CPM Right Knee CPM Right Knee: Off Right Knee Flexion (Degrees): 60  Right Knee Extension (Degrees): 0   GP     Kelly Watson 04/01/2012, 2:53 PM  Kelly Watson, PT [phone]

## 2012-04-01 NOTE — Preoperative (Signed)
Beta Blockers   Reason not to administer Beta Blockers:Not Applicable 

## 2012-04-01 NOTE — Op Note (Signed)
MRN:     [id number] DOB/AGE:    1950/06/30 / 62 y.o.       OPERATIVE REPORT    DATE OF PROCEDURE:  04/01/2012       PREOPERATIVE DIAGNOSIS:   DJD RIGHT KNEE      Estimated Body mass index is 34.18 kg/(m^2) as calculated from the following:   Height as of 02/19/12: 5' 9.5"(1.765 m).   Weight as of 02/19/12: 234 lb 12.8 oz(106.505 kg).                                                        POSTOPERATIVE DIAGNOSIS:   degenerative joint disease right knee                                                                      PROCEDURE:  Procedure(s): TOTAL KNEE ARTHROPLASTY Using Depuy Sigma RP implants #3 Femur, #4Tibia, 10mm sigma RP bearing, 35 Patella     SURGEON: , A    ASSISTANT:  Kirstin Shepperson PA-C   (Present and scrubbed throughout the case, critical for assistance with exposure, retraction, instrumentation, and closure.)         ANESTHESIA: GET with Femoral Nerve Block  DRAINS: foley, 2 medium hemovac in knee   TOURNIQUET TIME:   COMPLICATIONS:  None     SPECIMENS: None   INDICATIONS FOR PROCEDURE: The patient has  DJD RIGHT KNEE, varus deformities, XR shows bone on bone arthritis. Patient has failed all conservative measures including anti-inflammatory medicines, narcotics, attempts at  exercise and weight loss, cortisone injections and viscosupplementation.  Risks and benefits of surgery have been discussed, questions answered.   DESCRIPTION OF PROCEDURE: The patient identified by armband, received  right femoral nerve block and IV antibiotics, in the holding area at [facility]. Patient taken to the operating room, appropriate anesthetic  monitors were attached General endotracheal anesthesia induced with  the patient in supine position, Foley catheter was inserted. Tourniquet  applied high to the operative thigh. Lateral post and foot positioner  applied to the table, the lower extremity was then prepped and draped  in usual sterile fashion  from the ankle to the tourniquet. Time-out procedure was performed. The limb was wrapped with an Esmarch bandage and the tourniquet inflated to 365 mmHg. We began the operation by making the anterior midline incision starting at handbreadth above the patella going over the patella 1 cm medial to and  4 cm distal to the tibial tubercle. Small bleeders in the skin and the  subcutaneous tissue identified and cauterized. Transverse retinaculum was incised and reflected medially and a medial parapatellar arthrotomy was accomplished. the patella was everted and theprepatellar fat pad resected. The superficial medial collateral  ligament was then elevated from anterior to posterior along the proximal  flare of the tibia and anterior half of the menisci resected. The knee was hyperflexed exposing bone on bone arthritis. Peripheral and notch osteophytes as well as the cruciate ligaments were then resected. We continued to  work our way around posteriorly along the proximal tibia, and externally  rotated the tibia subluxing it out from underneath the femur. A McHale  retractor was placed through the notch and a lateral Hohmann retractor  placed, and we then drilled through the proximal tibia in line with the  axis of the tibia followed by an intramedullary guide rod and 2-degree  posterior slope cutting guide. The tibial cutting guide was pinned into place  allowing resection of 4 mm of bone medially and about 6 mm of bone  laterally because of her varus deformity. Satisfied with the tibial resection, we then  entered the distal femur 2 mm anterior to the PCL origin with the  intramedullary guide rod and applied the distal femoral cutting guide  set at 11mm, with 5 degrees of valgus. This was pinned along the  epicondylar axis. At this point, the distal femoral cut was accomplished without difficulty. We then sized for a #3 femoral component and pinned the guide in 3 degrees of external rotation.The chamfer  cutting guide was pinned into place. The anterior, posterior, and chamfer cuts were accomplished without difficulty followed by  the Sigma RP box cutting guide and the box cut. We also removed posterior osteophytes from the posterior femoral condyles. At this  time, the knee was brought into full extension. We checked our  extension and flexion gaps and found them symmetric at 10mm.  The patella thickness measured at 25 mm. We set the cutting guide at 15 and removed the posterior 9.5-10 mm  of the patella sized for 35 button and drilled the lollipop. The knee  was then once again hyperflexed exposing the proximal tibia. We sized for a #4 tibial base plate, applied the smokestack and the conical reamer followed by the the Delta fin keel punch. We then hammered into place the Sigma RP trial femoral component, inserted a 10-mm trial bearing, trial patellar button, and took the knee through range of motion from 0-130 degrees. No thumb pressure was required for patellar  tracking. At this point, all trial components were removed, a double batch of DePuy HV cement with 1500 mg of Zinacef was mixed and applied to all bony metallic mating surfaces except for the posterior condyles of the femur itself. In order, we  hammered into place the tibial tray and removed excess cement, the femoral component and removed excess cement, a 10-mm Sigma RP bearing  was inserted, and the knee brought to full extension with compression.  The patellar button was clamped into place, and excess cement  removed. While the cement cured the wound was irrigated out with normal saline solution pulse lavage, and medium Hemovac drains were placed.. Ligament stability and patellar tracking were checked and found to be excellent. The tourniquet was then released and hemostasis was obtained with cautery. The parapatellar arthrotomy was closed with  #1 ethibond suture. The subcutaneous tissue with 0 and 2-0 undyed  Vicryl suture, and 4-0  Monocryl.. A dressing of Xeroform,  4 x 4, dressing sponges, Webril, and Ace wrap applied. Needle and sponge count were correct times 2.The patient awakened, extubated, and taken to recovery room without difficulty. Vascular status was normal, pulses 2+ and symmetric.   , A 04/01/2012, 9:04 AM

## 2012-04-01 NOTE — Progress Notes (Signed)
UR COMPLETED  

## 2012-04-01 NOTE — Anesthesia Preprocedure Evaluation (Signed)
Anesthesia Evaluation  Patient identified by MRN, date of birth, ID band Patient awake    Reviewed: Allergy & Precautions, H&P , NPO status , Patient's Chart, lab work & pertinent test results  Airway Mallampati: II  Neck ROM: full    Dental   Pulmonary former smoker,          Cardiovascular hypertension,     Neuro/Psych    GI/Hepatic   Endo/Other  Hypothyroidism obese  Renal/GU      Musculoskeletal   Abdominal   Peds  Hematology   Anesthesia Other Findings   Reproductive/Obstetrics                           Anesthesia Physical Anesthesia Plan  ASA: II  Anesthesia Plan: General and Regional   Post-op Pain Management: MAC Combined w/ Regional for Post-op pain   Induction: Intravenous  Airway Management Planned: LMA  Additional Equipment:   Intra-op Plan:   Post-operative Plan:   Informed Consent: I have reviewed the patients History and Physical, chart, labs and discussed the procedure including the risks, benefits and alternatives for the proposed anesthesia with the patient or authorized representative who has indicated his/her understanding and acceptance.     Plan Discussed with: CRNA and Surgeon  Anesthesia Plan Comments:         Anesthesia Quick Evaluation

## 2012-04-01 NOTE — Anesthesia Procedure Notes (Addendum)
Anesthesia Regional Block:  Femoral nerve block  Pre-Anesthetic Checklist: ,, timeout performed, Correct Patient, Correct Site, Correct Laterality, Correct Procedure,, site marked, risks and benefits discussed, Surgical consent,  Pre-op evaluation,  At surgeon's request and post-op pain management  Laterality: Right  Prep: chloraprep       Needles:  Injection technique: Single-shot  Needle Type: Echogenic Stimulator Needle     Needle Length: 9cm  Needle Gauge: 21    Additional Needles:  Procedures: nerve stimulator Femoral nerve block  Nerve Stimulator or Paresthesia:  Response: Quadriceps muscle contraction, 0.45 mA,   Additional Responses:   Narrative:  Start time: 04/01/2012 7:00 AM End time: 04/01/2012 7:13 AM Injection made incrementally with aspirations every 5 mL.  Performed by: Personally  Anesthesiologist: Dr Chaney Malling  Additional Notes: Functioning IV was confirmed and monitors were applied.  A 90mm 21ga Arrow echogenic stimulator needle was used. Sterile prep and drape,hand hygiene and sterile gloves were used.  Negative aspiration and negative test dose prior to incremental administration of local anesthetic. The patient tolerated the procedure well.    Femoral nerve block Procedure Name: Intubation Date/Time: 04/01/2012 7:20 AM Performed by: Donette Larry E Pre-anesthesia Checklist: Patient identified, Timeout performed, Emergency Drugs available, Suction available and Patient being monitored Patient Re-evaluated:Patient Re-evaluated prior to inductionOxygen Delivery Method: Circle system utilized Preoxygenation: Pre-oxygenation with 100% oxygen Intubation Type: IV induction Ventilation: Mask ventilation without difficulty Laryngoscope Size: Miller and 2 Grade View: Grade II Tube type: Oral Tube size: 7.5 mm Number of attempts: 1 Airway Equipment and Method: Stylet Placement Confirmation: ETT inserted through vocal cords under direct vision,  positive ETCO2  and breath sounds checked- equal and bilateral Secured at: 23 cm Tube secured with: Tape Dental Injury: Teeth and Oropharynx as per pre-operative assessment

## 2012-04-01 NOTE — H&P (View-Only) (Signed)
TOTAL KNEE ADMISSION H&P  Patient is being admitted for right total knee arthroplasty.  Subjective:  Chief Complaint:right knee pain.  HPI: Kelly Watson, 62 y.o. female, has a history of pain and functional disability in the right knee due to arthritis and has failed non-surgical conservative treatments for greater than 12 weeks to includeNSAID's and/or analgesics, corticosteriod injections, viscosupplementation injections and flexibility and strengthening excercises.  Onset of symptoms was gradual, starting 4 years ago with gradually worsening course since that time. The patient noted no past surgery on the right knee(s).  Patient currently rates pain in the right knee(s) at 10 out of 10 with activity. Patient has night pain, worsening of pain with activity and weight bearing, pain that interferes with activities of daily living, crepitus and joint swelling.  Patient has evidence of subchondral sclerosis, periarticular osteophytes and joint space narrowing by imaging studies.  There is no active infection.  Patient Active Problem List   Diagnosis Date Noted  . Hypercholesterolemia   . Thyroid disease   . Hypertension   . Pulmonary embolism   . Hypothyroidism    Past Medical History  Diagnosis Date  . Hypercholesterolemia   . Hypothyroidism   . Hypertension   . Personal history of pulmonary embolism 2011  . Right knee DJD     Past Surgical History  Procedure Date  . Knee surgery 2011    left total knee  Pulmonary Embolism post op  . Shoulder surgery     right rotator cuff  . Abdominal hysterectomy     for fibroid  . Thyroidectomy     partial; due to benign thyroid nodule.   . Tubal ligation      (Not in a hospital admission) Allergies  Allergen Reactions  . Aspirin Hives  . Tramadol     Over sedates    Current Outpatient Prescriptions on File Prior to Visit  Medication Sig Dispense Refill  . Bioflavonoid Products (ESTER C PO) Take 500 mg by mouth daily.      .  calcium-vitamin D (OSCAL WITH D) 500-200 MG-UNIT per tablet Take 1 tablet by mouth daily.      . ferrous sulfate 325 (65 FE) MG tablet Take 2 tablets by mouth daily.      . GLUCOSAMINE PO Take 1,000 mg by mouth 2 (two) times daily.      . levothyroxine (SYNTHROID, LEVOTHROID) 75 MCG tablet Take 75 mcg by mouth daily.      . LORazepam (ATIVAN) 0.5 MG tablet Take 1 tablet by mouth every 8 (eight) hours as needed. anxiety      . magnesium oxide (MAG-OX) 400 MG tablet Take 800 mg by mouth daily.      . Melatonin 10 MG CAPS Take 1 tablet by mouth at bedtime.      . metoprolol succinate (TOPROL-XL) 50 MG 24 hr tablet Take 25 mg by mouth daily. Take with or immediately following a meal.      . Multiple Vitamin (MULTIVITAMIN) tablet Take 2 tablets by mouth daily.      . Omega-3 Fatty Acids (FISH OIL) 300 MG CAPS Take 1 capsule by mouth daily.      . OVER THE COUNTER MEDICATION Calm forte as needed      . OVER THE COUNTER MEDICATION Restful legs as needed      . potassium gluconate (HM POTASSIUM) 595 MG TABS Take 595 mg by mouth 2 (two) times daily.      . simvastatin (ZOCOR) 40 MG   tablet Take 40 mg by mouth every evening.      . SUPER B COMPLEX/C PO Take 2 tablets by mouth daily.       . vitamin E 400 UNIT capsule Take 800 Units by mouth daily.       . VOLTAREN 1 % GEL Apply 2 g topically daily as needed. pain        History  Substance Use Topics  . Smoking status: Former Smoker -- 0.5 packs/day for 15 years    Types: Cigarettes    Quit date: 02/27/1985  . Smokeless tobacco: Never Used  . Alcohol Use: No    Family History  Problem Relation Age of Onset  . Asthma Daughter   . Breast cancer Mother   . Breast cancer Maternal Grandmother   . Cancer Brother     prostate     Review of Systems  Constitutional: Negative.   HENT: Negative.   Eyes: Negative.   Respiratory: Negative.   Cardiovascular: Negative.   Gastrointestinal: Negative.   Genitourinary: Negative.   Musculoskeletal:  Positive for joint pain.  Skin: Negative.   Neurological: Negative.   Endo/Heme/Allergies: Negative.   Psychiatric/Behavioral: Negative.     Objective:  Physical Exam  Constitutional: She is oriented to person, place, and time. She appears well-developed and well-nourished.  HENT:  Head: Normocephalic and atraumatic.  Eyes: Conjunctivae normal and EOM are normal. Pupils are equal, round, and reactive to light.  Neck: Neck supple.  Cardiovascular: Regular rhythm.   Respiratory: Effort normal and breath sounds normal.  GI: Soft. Bowel sounds are normal.  Genitourinary:       Not pertinent to current symptomatology therefore not examined.  Musculoskeletal:       Respirations are 12 and unlabored. Examination of her right knee reveals 1 to 2+ crepitation 1+ synovitis diffuse pain, range of motion -5 to 120 degrees knee is stable with normal patella tracking. Exam of the left knee reveals well healed total knee incision without swelling or pain range of motion 0-120 degrees knee is stable with normal patella tracking. Vascular exam: pulses 2+ and symmetric.  Neurological: She is alert and oriented to person, place, and time.  Skin: Skin is warm and dry.  Psychiatric: She has a normal mood and affect. Her behavior is normal. Thought content normal.    Vital signs in last 24 hours: Last recorded: 01/22 1500   BP: 159/82 Pulse: 70  Temp: 98.5 F (36.9 C)    Height: 5' 10" (1.778 m) SpO2: 99  Weight: 107.502 kg (237 lb)     Labs:   Estimated Body mass index is 34.01 kg/(m^2) as calculated from the following:   Height as of this encounter: 5' 10"(1.778 m).   Weight as of this encounter: 237 lb(107.502 kg).   Imaging Review Plain radiographs demonstrate severe degenerative joint disease of the right knee(s). The overall alignment ismild valgus. The bone quality appears to be good for age and reported activity level.  Assessment/Plan:  End stage arthritis, right knee  Patient  Active Problem List  Diagnosis  . Hypercholesterolemia  . Thyroid disease  . Hypertension  . Pulmonary embolism  . Hypothyroidism   The patient history, physical examination, clinical judgment of the provider and imaging studies are consistent with end stage degenerative joint disease of the right knee(s) and total knee arthroplasty is deemed medically necessary. The treatment options including medical management, injection therapy arthroscopy and arthroplasty were discussed at length. The risks and benefits of total knee   arthroplasty were presented and reviewed. The risks due to aseptic loosening, infection, stiffness, patella tracking problems, thromboembolic complications and other imponderables were discussed. The patient acknowledged the explanation, agreed to proceed with the plan and consent was signed. Patient is being admitted for inpatient treatment for surgery, pain control, PT, OT, prophylactic antibiotics, VTE prophylaxis, progressive ambulation and ADL's and discharge planning. The patient is planning to be discharged home with home health services   A. , PA-C Physician Assistant [facility] [phone]  03/20/2012, 3:48 PM 

## 2012-04-02 ENCOUNTER — Encounter (HOSPITAL_COMMUNITY): Payer: Self-pay | Admitting: General Practice

## 2012-04-02 LAB — CBC
Hemoglobin: 10.8 g/dL — ABNORMAL LOW (ref 12.0–15.0)
MCHC: 33.6 g/dL (ref 30.0–36.0)
Platelets: 202 10*3/uL (ref 150–400)
RDW: 13 % (ref 11.5–15.5)

## 2012-04-02 LAB — BASIC METABOLIC PANEL
GFR calc Af Amer: >90 mL/min (ref >90)
GFR calc non Af Amer: 87 mL/min — ABNORMAL LOW (ref >90)
Potassium: 4.1 mEq/L (ref 3.5–5.1)
Sodium: 139 mEq/L (ref 135–145)

## 2012-04-02 MED ORDER — OXYCODONE HCL 5 MG PO TABS: ORAL_TABLET | ORAL | Status: DC

## 2012-04-02 MED ORDER — ENOXAPARIN SODIUM 30 MG/0.3ML ~~LOC~~ SOLN: 30.0000 mg | Freq: Two times a day (BID) | SUBCUTANEOUS | Status: DC

## 2012-04-02 MED ORDER — POLYETHYLENE GLYCOL 3350 17 GM/SCOOP PO POWD: 17.0000 g | Freq: Two times a day (BID) | ORAL | Status: DC

## 2012-04-02 MED ORDER — ACETAMINOPHEN 325 MG PO TABS: 650.0000 mg | ORAL_TABLET | Freq: Four times a day (QID) | ORAL | Status: AC | PRN

## 2012-04-02 NOTE — Progress Notes (Signed)
Seen and agreed 04/02/2012 Robinette, Julia Elizabeth PTA [phone] pager [phone] office    

## 2012-04-02 NOTE — Anesthesia Postprocedure Evaluation (Signed)
  Anesthesia Post-op Note  Patient: Kelly Watson  Procedure(s) Performed: Procedure(s) (LRB) with comments: TOTAL KNEE ARTHROPLASTY (Right)  Patient Location: Nursing Unit  Anesthesia Type:General  Level of Consciousness: awake, alert , oriented and patient cooperative  Airway and Oxygen Therapy: Patient Spontanous Breathing  Post-op Pain: mild  Post-op Assessment: Post-op Vital signs reviewed, Patient's Cardiovascular Status Stable, Respiratory Function Stable, Patent Airway, No signs of Nausea or vomiting, Adequate PO intake, Pain level controlled, No headache, No backache, No residual numbness and No residual motor weakness  Post-op Vital Signs: Reviewed and stable  Complications: No apparent anesthesia complications

## 2012-04-02 NOTE — Care Management Note (Signed)
    Page 1 of 1   04/02/2012     11:10:43 AM   CARE MANAGEMENT NOTE 04/02/2012  Patient:  AGAM, TUOHY   Account Number:  [id number]  Date Initiated:  04/01/2012  Documentation initiated by:  Anette Guarneri  Subjective/Objective Assessment:   right TKA  Plans d/c home with [facility] services  has DME w/TnT     Action/Plan:   Home with [facility] services   Anticipated DC Date:  04/02/2012   Anticipated DC Plan:  HOME W HOME HEALTH SERVICES      DC Planning Services  CM consult      Choice offered to / List presented to:             Status of service:  Completed, signed off Medicare Important Message given?  NO (If response is "NO", the following Medicare IM given date fields will be blank) Date Medicare IM given:   Date Additional Medicare IM given:    Discharge Disposition:  HOME W HOME HEALTH SERVICES  Per UR Regulation:  Reviewed for med. necessity/level of care/duration of stay  If discussed at Long Length of Stay Meetings, dates discussed:    Comments:  04/02/12 16:20 Anette Guarneri RN/CM Spoke with patient  regarding d/c planning, MD office has pre-arranged [facility] services w/[facility] TnT has delivered RW/3n1 and CPM to home.

## 2012-04-02 NOTE — Evaluation (Signed)
Occupational Therapy Evaluation Patient Details Name: Kelly Watson MRN: [id number] DOB: December 16, 1950 Today's Date: 04/02/2012 Time: 4098-1191 OT Time Calculation (min): 29 min  OT Assessment / Plan / Recommendation Clinical Impression  Pt presents to OT s/p R TKR.  Husband will assist as needed with ADL activity. Education complete.    OT Assessment  Patient does not need any further OT services    Follow Up Recommendations  No OT follow up       Equipment Recommendations  None recommended by OT          Precautions / Restrictions Precautions Precautions: Knee Required Braces or Orthoses: Knee Immobilizer - Right Knee Immobilizer - Right: On when out of bed or walking;Discontinue once straight leg raise with < 10 degree lag Restrictions Weight Bearing Restrictions: Yes RLE Weight Bearing: Weight bearing as tolerated       ADL  Grooming: Performed;Wash/dry hands;Supervision/safety Where Assessed - Grooming: Unsupported standing Upper Body Bathing: Simulated Where Assessed - Upper Body Bathing: Unsupported sitting Lower Body Bathing: Simulated;Moderate assistance Where Assessed - Lower Body Bathing: Unsupported sit to stand Upper Body Dressing: Simulated;Set up Where Assessed - Upper Body Dressing: Unsupported sitting Lower Body Dressing: Simulated;Moderate assistance Where Assessed - Lower Body Dressing: Unsupported sit to stand Toilet Transfer: Performed;Supervision/safety Toilet Transfer Method: Sit to Barista: Comfort height toilet;Grab bars Toileting - Clothing Manipulation and Hygiene: Performed;Supervision/safety Where Assessed - Toileting Clothing Manipulation and Hygiene: Standing Transfers/Ambulation Related to ADLs: Husband will assist as needed. Pt and husband verbalized safety with tub transfer using tub bench in which pt already has        Visit Information  Last OT Received On: 04/02/12 Assistance Needed: +1        Prior Functioning     Home Living Lives With: Spouse Available Help at Discharge: Family;Available 24 hours/day Type of Home: House Home Access: Stairs to enter [employer] of Steps: 3 Entrance Stairs-Rails: None Home Layout: Two level;Able to live on main level with bedroom/bathroom;Laundry or work area in basement [employer] Shower/Tub: Engineer, manufacturing systems: [employer] Equipment: Bedside commode/3-in-1;Walker - rolling;Tub transfer bench Prior Function Level of Independence: Independent Able to Take Stairs?: Yes Driving: Yes Vocation: Full time employment Communication Communication: No difficulties         Vision/Perception Vision - History Patient Visual Report: No change from baseline   Cognition  Cognition Overall Cognitive Status: Appears within functional limits for tasks assessed/performed Arousal/Alertness: Awake/alert Orientation Level: Appears intact for tasks assessed Behavior During Session: [facility] for tasks performed    Extremity/Trunk Assessment Right Upper Extremity Assessment RUE ROM/Strength/Tone: Within functional levels Left Upper Extremity Assessment LUE ROM/Strength/Tone: Within functional levels Right Lower Extremity Assessment RLE ROM/Strength/Tone: Deficits RLE ROM/Strength/Tone Deficits: decreased strength post op Left Lower Extremity Assessment LLE ROM/Strength/Tone: Within functional levels LLE Sensation: WFL - Light Touch Trunk Assessment Trunk Assessment: Normal     Mobility Transfers Transfers: Sit to Stand;Stand to Sit Sit to Stand: 5: Supervision;With upper extremity assist;From chair/3-in-1;From toilet Stand to Sit: 5: Supervision;With upper extremity assist;To toilet;To chair/3-in-1 Details for Transfer Assistance: cues for backing, hand placement and controlling descent     Exercise Total Joint Exercises Ankle Circles/Pumps: AROM;Both;15 reps Quad Sets: AROM;10 reps;Right Heel Slides:  AROM;Right;10 reps;Limitations Heel Slides Limitations: increase pain with heel slides      End of Session OT - End of Session Equipment Utilized During Treatment: Right knee immobilizer Activity Tolerance: Patient tolerated treatment well Patient left: in chair;with family/visitor present;with call  bell/phone within reach CPM Right Knee CPM Right Knee: Off  GO     Alba Cory 04/02/2012, 10:45 AM

## 2012-04-02 NOTE — Discharge Summary (Signed)
Patient ID: Kelly Watson MRN: [id number] DOB/AGE: [birth year] 63 y.o.  Admit date: 2012-04-20 Discharge date: 04/02/2012  Admission Diagnoses:  Principal Problem:  *Right knee DJD Active Problems:  Hypercholesterolemia  Thyroid disease  Hypertension  Pulmonary embolism  Hypothyroidism  S/P total knee replacement left   Discharge Diagnoses:  Same  Past Medical History  Diagnosis Date  . Hypercholesterolemia   . Hypothyroidism   . Hypertension   . Personal history of pulmonary embolism 2011  . Right knee DJD     Surgeries: Procedure(s): TOTAL KNEE ARTHROPLASTY on 04-20-12   Consultants:    Discharged Condition: Improved  Hospital Course: Kelly Watson is an 62 y.o. female who was admitted 20-Apr-2012 for operative treatment ofRight knee DJD. Patient has severe unremitting pain that affects sleep, daily activities, and work/hobbies. After pre-op clearance the patient was taken to the operating room on 2012-04-20 and underwent  Procedure(s): TOTAL KNEE ARTHROPLASTY.    Patient was given perioperative antibiotics: Anti-infectives     Start     Dose/Rate Route Frequency Ordered Stop   2012-04-20 0806   cefUROXime (ZINACEF) injection  Status:  Discontinued          As needed April 20, 2012 0806 2012/04/20 0931   04-20-12 0600   ceFAZolin (ANCEF) 3 g in dextrose 5 % 50 mL IVPB        3 g 160 mL/hr over 30 Minutes Intravenous On call to O.R. 03/31/12 1214 April 20, 2012 2130           Patient was given sequential compression devices, early ambulation, and chemoprophylaxis to prevent DVT.  Patient benefited maximally from hospital stay and there were no complications.    Recent vital signs: Patient Vitals for the past 24 hrs:  BP Temp Temp src Pulse Resp SpO2  04/02/12 0634 114/50 mmHg 98.2 F (36.8 C) Oral 66  18  97 %  04/02/12 0213 124/45 mmHg 97.9 F (36.6 C) Oral 80  18  98 %  04/20/2012 2143 138/63 mmHg 97.9 F (36.6 C) Oral 72  18  97 %  2012/04/20 1445 - - - - - 98 %   04-20-12 1130 - - - 75  14  98 %  04/20/12 1115 - - - 75  11  100 %  04/20/2012 1107 142/63 mmHg - - - - -  Apr 20, 2012 1100 - - - 62  10  99 %  2012/04/20 1052 131/62 mmHg - - - - -  04/20/2012 1045 - - - 62  12  97 %  2012-04-20 1037 117/106 mmHg - - - - -  04/20/2012 1030 - - - 74  11  97 %  2012/04/20 1022 167/75 mmHg - - - - -  2012/04/20 1015 - - - 72  11  100 %  2012/04/20 1007 164/69 mmHg - - - - -  April 20, 2012 1000 - - - 65  12  100 %  04-20-2012 0952 163/68 mmHg - - - - -  2012/04/20 0945 - - - 72  12  100 %  April 20, 2012 0937 158/77 mmHg 97.6 F (36.4 C) - 79  13  99 %     Recent laboratory studies:  Basename 04/02/12 0405 Apr 20, 2012 1330  WBC 9.6 14.4*  HGB 10.8* 12.3  HCT 32.1* 37.3  PLT 202 205  NA 139 --  K 4.1 --  CL 105 --  CO2 25 --  BUN 12 --  CREATININE 0.79 0.78  GLUCOSE 129* --  INR -- --  CALCIUM  8.8 --     Discharge Medications:     Medication List     As of 04/02/2012  8:17 AM    STOP taking these medications         ESTER C PO      Fish Oil 300 MG Caps      GLUCOSAMINE PO      vitamin E 400 UNIT capsule      VOLTAREN 1 % Gel   Generic drug: diclofenac sodium      TAKE these medications         acetaminophen 325 MG tablet   Commonly known as: TYLENOL   Take 2 tablets (650 mg total) by mouth every 6 (six) hours as needed for pain (or Fever >/= 101).      calcium-vitamin D 500-200 MG-UNIT per tablet   Commonly known as: OSCAL WITH D   Take 1 tablet by mouth daily.      enoxaparin 30 MG/0.3ML injection   Commonly known as: LOVENOX   Inject 0.3 mLs (30 mg total) into the skin every 12 (twelve) hours.      ferrous sulfate 325 (65 FE) MG tablet   Take 2 tablets by mouth daily.      HM POTASSIUM 595 MG Tabs   Generic drug: potassium gluconate   Take 595 mg by mouth 2 (two) times daily.      levothyroxine 75 MCG tablet   Commonly known as: SYNTHROID, LEVOTHROID   Take 75 mcg by mouth daily.      LORazepam 0.5 MG tablet   Commonly known as: ATIVAN    Take 1 tablet by mouth every 8 (eight) hours as needed. anxiety      magnesium oxide 400 MG tablet   Commonly known as: MAG-OX   Take 800 mg by mouth daily.      Melatonin 10 MG Caps   Take 1 tablet by mouth at bedtime.      metoprolol succinate 50 MG 24 hr tablet   Commonly known as: TOPROL-XL   Take 25 mg by mouth daily. Take with or immediately following a meal.      multivitamin tablet   Take 2 tablets by mouth daily.      OVER THE COUNTER MEDICATION   Calm forte as needed      OVER THE COUNTER MEDICATION   Restful legs as needed      oxyCODONE 5 MG immediate release tablet   Commonly known as: Oxy IR/ROXICODONE   1-2 tablets every 4-6 hrs as needed for pain      polyethylene glycol powder powder   Commonly known as: GLYCOLAX/MIRALAX   Take 17 g by mouth 2 (two) times daily.      simvastatin 40 MG tablet   Commonly known as: ZOCOR   Take 40 mg by mouth every evening.      SUPER B COMPLEX/C PO   Take 2 tablets by mouth daily.        Diagnostic Studies: Dg Chest 2 View  03/26/2012  *RADIOLOGY REPORT*  Clinical Data: Gout.  Hypertension.  CHEST - 2 VIEW  Comparison: 03/11/2012  Findings: Heart and mediastinal contours are within normal limits. No focal opacities or effusions.  No acute bony abnormality.  IMPRESSION: No active cardiopulmonary disease.   Original Report Authenticated By: Charlett Nose, M.D.     Disposition: 01-Home or Self Care      Discharge Orders    Future Appointments: Provider: Department: Dept Phone: Center:  04/08/2012 10:15 AM Mauri [facility] MEDICAL ONCOLOGY [phone] None   04/08/2012 10:45 AM Myrtis Ser, NP [facility] MEDICAL ONCOLOGY [phone] None     Future Orders Please Complete By Expires   Diet - low sodium heart healthy      Call MD / Call 911      Comments:   If you experience chest pain or shortness of breath, CALL 911 and be transported to the hospital emergency room.  If  you develope a fever above 101 F, pus (white drainage) or increased drainage or redness at the wound, or calf pain, call your surgeon's office.   Discharge instructions      Comments:   Total Knee Replacement Care After Refer to this sheet in the next few weeks. These discharge instructions provide you with general information on caring for yourself after you leave the hospital. Your caregiver may also give you specific instructions. Your treatment has been planned according to the most current medical practices available, but unavoidable complications sometimes occur. If you have any problems or questions after discharge, please call your caregiver. Regaining a near full range of motion of your knee within the first 3 to 6 weeks after surgery is critical. HOME CARE INSTRUCTIONS  You may resume a normal diet and activities as directed.  Perform exercises as directed.  Place yellow foam block, yellow side up under heel at all times except when in CPM or when walking.  DO NOT modify, tear, cut, or change in any way. You will receive physical therapy daily  Take showers instead of baths until informed otherwise.  Change bandages (dressings)daily Do not take over-the-counter or prescription medicines for pain, discomfort, or fever. Eat a well-balanced diet.  Avoid lifting or driving until you are instructed otherwise.  Make an appointment to see your caregiver for stitches (suture) or staple removal as directed.  If you have been sent home with a continuous passive motion machine (CPM machine), 0-90 degrees 6 hrs a day   2 hrs a shift SEEK MEDICAL CARE IF: You have swelling of your calf or leg.  You develop shortness of breath or chest pain.  You have redness, swelling, or increasing pain in the wound.  There is pus or any unusual drainage coming from the surgical site.  You notice a bad smell coming from the surgical site or dressing.  The surgical site breaks open after sutures or staples have  been removed.  There is persistent bleeding from the suture or staple line.  You are getting worse or are not improving.  You have any other questions or concerns.  SEEK IMMEDIATE MEDICAL CARE IF:  You have a fever.  You develop a rash.  You have difficulty breathing.  You develop any reaction or side effects to medicines given.  Your knee motion is decreasing rather than improving.  MAKE SURE YOU:  Understand these instructions.  Will watch your condition.  Will get help right away if you are not doing well or get worse.   Constipation Prevention      Comments:   Drink plenty of fluids.  Prune juice may be helpful.  You may use a stool softener, such as Colace (over the counter) 100 mg twice a day.  Use MiraLax (over the counter) for constipation as needed.   Increase activity slowly as tolerated      CPM      Comments:   Continuous passive motion machine (CPM):  Use the CPM from 0 to 90 for 6 hours per day.       You may break it up into 2 or 3 sessions per day.      Use CPM for 2 weeks or until you are told to stop.   TED hose      Comments:   Use stockings (TED hose) for 2 weeks on both leg(s).  You may remove them at night for sleeping.   Change dressing      Comments:   Change the dressing daily with sterile 4 x 4 inch gauze dressing and apply TED hose.  You may clean the incision with alcohol prior to redressing.   Do not put a pillow under the knee. Place it under the heel.      Comments:   Place yellow foam block, yellow side up under heel at all times except when in CPM or when walking.  DO NOT modify, tear, cut, or change in any way the yellow foam block.      Follow-up Information    Follow up with Nilda Simmer, MD. On 04/15/2012. (appt time 2 pm)    Contact information:   [address] [place] [address] [phone]           Signed: Pascal Lux 04/02/2012, 8:17 AM

## 2012-04-02 NOTE — Progress Notes (Signed)
Physical Therapy Treatment Patient Details Name: Kelly Watson MRN: [id number] DOB: [birth year] Today's Date: 04/02/2012 Time: 0802-0828 PT Time Calculation (min): 26 min  PT Assessment / Plan / Recommendation Comments on Treatment Session  Pt s/p R TKA. Added exercises and provided handout. Added stair training demontration and instruction with husband present. Good return demo from pt and husband. Pt and husband comfortable with D/C home as they both have experience with knee replacement.     Follow Up Recommendations  Home health PT     Does the patient have the potential to tolerate intense rehabilitation                       Frequency 7X/week   Plan Discharge plan remains appropriate;Frequency remains appropriate    Precautions / Restrictions Precautions Precautions: Knee Required Braces or Orthoses: Knee Immobilizer - Right Knee Immobilizer - Right: On when out of bed or walking;Discontinue once straight leg raise with < 10 degree lag Restrictions RLE Weight Bearing: Weight bearing as tolerated   Pertinent Vitals/Pain 6/10 with exercises. Pt premedicated.    Mobility  Transfers Sit to Stand: 4: Min guard Details for Transfer Assistance: cues for backing, hand placement and controlling descent Ambulation/Gait Ambulation/Gait Assistance: 5: Supervision Ambulation Distance (Feet): 100 Feet Assistive device: Rolling walker Ambulation/Gait Assistance Details: cueing for RW manangement Gait Pattern: Decreased stance time - right;Step-to pattern General Gait Details: one evidence of knee buckling noted with no loss of balance Stairs: Yes Stairs Assistance: 4: Min assist Stairs Assistance Details (indicate cue type and reason): Husband MinA to stabilize RW and safety Stair Management Technique: No rails;Backwards;With walker Number of Stairs: 2  (x2)    Exercises Total Joint Exercises Ankle Circles/Pumps: AROM;Both;15 reps Quad Sets: AROM;10 reps;Right Heel  Slides: AROM;Right;10 reps;Limitations Heel Slides Limitations: increase pain with heel slides    PT Goals Acute Rehab PT Goals PT Goal: Stand to Sit - Progress: Progressing toward goal PT Goal: Ambulate - Progress: Progressing toward goal PT Goal: Up/Down Stairs - Progress: Met PT Goal: Perform Home Exercise Program - Progress: Progressing toward goal  Visit Information  Last PT Received On: 04/02/12 Assistance Needed: +1    Subjective Data      Cognition  Cognition Overall Cognitive Status: Appears within functional limits for tasks assessed/performed Arousal/Alertness: Awake/alert Orientation Level: Appears intact for tasks assessed Behavior During Session: [facility] for tasks performed    Balance     End of Session PT - End of Session Activity Tolerance: Patient tolerated treatment well Patient left: in chair;with call bell/phone within reach;with family/visitor present;with nursing in room   GP     Lazaro Arms 04/02/2012, 8:45 AM

## 2012-04-08 ENCOUNTER — Other Ambulatory Visit (HOSPITAL_BASED_OUTPATIENT_CLINIC_OR_DEPARTMENT_OTHER): Payer: 59 | Admitting: Lab

## 2012-04-08 ENCOUNTER — Encounter: Payer: Self-pay | Admitting: Oncology

## 2012-04-08 ENCOUNTER — Ambulatory Visit (HOSPITAL_BASED_OUTPATIENT_CLINIC_OR_DEPARTMENT_OTHER): Payer: 59 | Admitting: Oncology

## 2012-04-08 ENCOUNTER — Telehealth: Payer: Self-pay | Admitting: Oncology

## 2012-04-08 VITALS — BP 131/70 | HR 90 | Temp 98.4°F | Resp 20 | Ht 69.5 in | Wt 236.2 lb

## 2012-04-08 DIAGNOSIS — Z86711 Personal history of pulmonary embolism: Secondary | ICD-10-CM

## 2012-04-08 DIAGNOSIS — I2699 Other pulmonary embolism without acute cor pulmonale: Secondary | ICD-10-CM

## 2012-04-08 LAB — CBC WITH DIFFERENTIAL/PLATELET
BASO%: 0.5 % (ref 0.0–2.0)
Basophils Absolute: 0.1 10*3/uL (ref 0.0–0.1)
EOS%: 4 % (ref 0.0–7.0)
HGB: 10.3 g/dL — ABNORMAL LOW (ref 11.6–15.9)
MCH: 32.1 pg (ref 25.1–34.0)
MCHC: 33.4 g/dL (ref 31.5–36.0)
MCV: 96.1 fL (ref 79.5–101.0)
MONO%: 13 % (ref 0.0–14.0)
NEUT%: 64.6 % (ref 38.4–76.8)
RDW: 13.2 % (ref 11.2–14.5)

## 2012-04-08 NOTE — Telephone Encounter (Signed)
gv and printed appt schedule for pt for May °

## 2012-04-08 NOTE — Progress Notes (Signed)
[facility]  [phone] Fax:([phone]   OFFICE PROGRESS NOTE   Cc:  Neldon Labella, MD  DIAGNOSIS: History of pulmonary embolism with previous orthopedic surgery in 2011  PAST THERAPY: The patient initially received Coumadin postoperatively; however developed a pulmonary embolism about 8-10 days after her surgery. She was subsequently placed on Lovenox for 1-2 months, but developed hematuria on Lovenox.  CURRENT THERAPY: Lovenox 30 mg twice a day since 04/02/2012  INTERVAL HISTORY: Kelly Watson 62 y.o. female returns for routine followup with her husband. The patient underwent a right knee replacement on 04/01/2012. Patient is currently ambulating with a walker. She has physical therapy coming into her home. She denies chest pain, shortness of breath, dyspnea. No cough. She has a very small amount of epistaxis in the morning when she blows her nose, but no overt bleeding. Denies hemoptysis, hematemesis, hematuria, melena. Tolerating Lovenox injections well.   Past Medical History  Diagnosis Date  . Hypercholesterolemia   . Hypothyroidism   . Hypertension   . Personal history of pulmonary embolism 2011  . Right knee DJD   . Anxiety     Past Surgical History  Procedure Laterality Date  . Knee surgery  2011    left total knee  Pulmonary Embolism post op  . Shoulder surgery      right rotator cuff  . Abdominal hysterectomy      for fibroid  . Thyroidectomy      partial; due to benign thyroid nodule.   . Tubal ligation    . Total knee arthroplasty  04/01/2012    RIGHT KNEE  Dr Bertram Gala  . Total knee arthroplasty  04/01/2012    Procedure: TOTAL KNEE ARTHROPLASTY;  Surgeon: Nilda Simmer, MD;  Location: [facility] OR;  Service: Orthopedics;  Laterality: Right;    Current Outpatient Prescriptions  Medication Sig Dispense Refill  . docusate sodium (COLACE) 100 MG capsule Take 100 mg by mouth 2 (two) times daily.      Marland Kitchen acetaminophen (TYLENOL)  325 MG tablet Take 2 tablets (650 mg total) by mouth every 6 (six) hours as needed for pain (or Fever >/= 101).      . calcium-vitamin D (OSCAL WITH D) 500-200 MG-UNIT per tablet Take 1 tablet by mouth daily.      Marland Kitchen enoxaparin (LOVENOX) 30 MG/0.3ML injection Inject 0.3 mLs (30 mg total) into the skin every 12 (twelve) hours.  30 Syringe  3  . ferrous sulfate 325 (65 FE) MG tablet Take 2 tablets by mouth daily.      Marland Kitchen levothyroxine (SYNTHROID, LEVOTHROID) 75 MCG tablet Take 75 mcg by mouth daily.      Marland Kitchen LORazepam (ATIVAN) 0.5 MG tablet Take 1 tablet by mouth every 8 (eight) hours as needed. anxiety      . magnesium oxide (MAG-OX) 400 MG tablet Take 800 mg by mouth daily.      . Melatonin 10 MG CAPS Take 1 tablet by mouth at bedtime.      . metoprolol succinate (TOPROL-XL) 50 MG 24 hr tablet Take 25 mg by mouth daily. Take with or immediately following a meal.      . Multiple Vitamin (MULTIVITAMIN) tablet Take 2 tablets by mouth daily.      Marland Kitchen OVER THE COUNTER MEDICATION Calm forte as needed      . OVER THE COUNTER MEDICATION Restful legs as needed      . oxyCODONE (OXY IR/ROXICODONE) 5 MG immediate release tablet 1-2 tablets  every 4-6 hrs as needed for pain  100 tablet  0  . polyethylene glycol powder (MIRALAX) powder Take 17 g by mouth 2 (two) times daily.  255 g  0  . potassium gluconate (HM POTASSIUM) 595 MG TABS Take 595 mg by mouth 2 (two) times daily.      . simvastatin (ZOCOR) 40 MG tablet Take 40 mg by mouth every evening.      . SUPER B COMPLEX/C PO Take 2 tablets by mouth daily.        No current facility-administered medications for this visit.    ALLERGIES:  is allergic to aspirin and tramadol.  REVIEW OF SYSTEMS:  The rest of the 14-point review of system was negative.   Filed Vitals:   04/08/12 1040  BP: 131/70  Pulse: 90  Temp: 98.4 F (36.9 C)  Resp: 20   Wt Readings from Last 3 Encounters:  04/08/12 236 lb 3.2 oz (107.14 kg)  03/26/12 234 lb 12.6 oz (106.5 kg)    03/20/12 237 lb (107.502 kg)   ECOG Performance status: 1  PHYSICAL EXAMINATION:   General:  well-nourished in no acute distress.  Eyes:  no scleral icterus.  ENT:  There were no oropharyngeal lesions.  Neck was without thyromegaly.  Lymphatics:  Negative cervical, supraclavicular or axillary adenopathy.  Respiratory: lungs were clear bilaterally without wheezing or crackles.  Cardiovascular:  Regular rate and rhythm, S1/S2, without murmur, rub or gallop.  There was no pedal edema.  GI:  abdomen was soft, flat, nontender, nondistended, without organomegaly.  Muscoloskeletal:  no spinal tenderness of palpation of vertebral spine.  Right knee with edema and dressing. Skin exam was without echymosis, petichae.  Neuro exam was nonfocal.  Patient was able to get on and off exam table without assistance.  Patient was alerted and oriented.  Attention was good.   Language was appropriate.  Mood was normal without depression.  Speech was not pressured.  Thought content was not tangential.      LABORATORY/RADIOLOGY DATA:  Lab Results  Component Value Date   WBC 9.5 04/08/2012   HGB 10.3* 04/08/2012   HCT 31.0* 04/08/2012   PLT 272 04/08/2012   GLUCOSE 129* 04/02/2012   CHOL  Value: 134        ATP III CLASSIFICATION:  <200     mg/dL   Desirable  161-096  mg/dL   Borderline High  >=045    mg/dL   High        06/05/8117   TRIG 81 05/19/2009   HDL 43 05/19/2009   LDLCALC  Value: 75        Total Cholesterol/HDL:CHD Risk Coronary Heart Disease Risk Table                     Men   Women  1/2 Average Risk   3.4   3.3  Average Risk       5.0   4.4  2 X Average Risk   9.6   7.1  3 X Average Risk  23.4   11.0        Use the calculated Patient Ratio above and the CHD Risk Table to determine the patient's CHD Risk.        ATP III CLASSIFICATION (LDL):  <100     mg/dL   Optimal  147-829  mg/dL   Near or Above  Optimal  130-159  mg/dL   Borderline  161-096  mg/dL   High  >045     mg/dL   Very High 06/05/8117    ALKPHOS 64 03/26/2012   ALT 29 03/26/2012   AST 33 03/26/2012   NA 139 04/02/2012   K 4.1 04/02/2012   CL 105 04/02/2012   CREATININE 0.79 04/02/2012   BUN 12 04/02/2012   CO2 25 04/02/2012   INR 1.03 03/26/2012    Dg Chest 2 View  03/26/2012  *RADIOLOGY REPORT*  Clinical Data: Gout.  Hypertension.  CHEST - 2 VIEW  Comparison: 03/11/2012  Findings: Heart and mediastinal contours are within normal limits. No focal opacities or effusions.  No acute bony abnormality.  IMPRESSION: No active cardiopulmonary disease.   Original Report Authenticated By: Charlett Nose, M.D.     ASSESSMENT AND PLAN:   1. History of pulmonary embolism with previous orthopedic surgery 2011. This was a provoked event despite therapeutic INR on Coumadin. Thrombophilia workup was negative. Currently on Lovenox 30 mg twice a day and tolerating well. Recommend treatment for at least 3 months given her previous history of pulmonary embolism.  2. Hematuria history in 2011 on Lovenox with a negative urology workup. She is no hematuria at this time. The patient will contact us if she develops any hematuria.  3. Followup. The patient will be seen back for followup in approximately 3 months.    The length of time of the face-to-face encounter was 15 minutes. More than 50% of time was spent counseling and coordination of care.

## 2012-05-14 ENCOUNTER — Telehealth: Payer: Self-pay | Admitting: *Deleted

## 2012-05-14 NOTE — Telephone Encounter (Signed)
Patient managed by Dr. Gaylyn Rong for anticoagulation after a PE.  Julien Girt PA for Dr. Thurston Hole called to report this patient has received lovenox post-operatively and has hematomas from shots.  Previously was not allowed to receive Xarelto due to hematuria.  Patient does not have hematuria and we would like to know if she may begin Xarelto 10 mg or Xarelto 20mg .  Kirstin is aware Dr. Gaylyn Rong is out of office today and asked that he call the office [phone]) tomorrow with instructions.  Belenda Cruise will be in surgery tomorrow and can be paged [phone] if any questions.

## 2012-07-05 ENCOUNTER — Ambulatory Visit: Payer: 59 | Admitting: Oncology

## 2012-07-05 ENCOUNTER — Other Ambulatory Visit: Payer: 59 | Admitting: Lab

## 2012-07-19 ENCOUNTER — Ambulatory Visit (HOSPITAL_BASED_OUTPATIENT_CLINIC_OR_DEPARTMENT_OTHER): Payer: 59 | Admitting: Oncology

## 2012-07-19 ENCOUNTER — Other Ambulatory Visit (HOSPITAL_BASED_OUTPATIENT_CLINIC_OR_DEPARTMENT_OTHER): Payer: 59 | Admitting: Lab

## 2012-07-19 VITALS — BP 141/59 | HR 80 | Temp 98.4°F | Resp 18 | Ht 69.5 in | Wt 224.1 lb

## 2012-07-19 DIAGNOSIS — I2699 Other pulmonary embolism without acute cor pulmonale: Secondary | ICD-10-CM

## 2012-07-19 LAB — CBC WITH DIFFERENTIAL/PLATELET
EOS%: 3.7 % (ref 0.0–7.0)
MCH: 30.3 pg (ref 25.1–34.0)
MCV: 93.4 fL (ref 79.5–101.0)
MONO%: 12.8 % (ref 0.0–14.0)
NEUT#: 3.3 10*3/uL (ref 1.5–6.5)
RBC: 4.01 10*6/uL (ref 3.70–5.45)
RDW: 14 % (ref 11.2–14.5)
lymph#: 1.6 10*3/uL (ref 0.9–3.3)

## 2012-07-21 NOTE — Progress Notes (Signed)
[facility]  Telephone:(336) [phone] Fax:(336) [phone]   OFFICE PROGRESS NOTE   Cc:  Neldon Labella, MD  DIAGNOSIS: History of pulmonary embolism with previous orthopedic surgery in 2011  PAST THERAPY: The patient initially received Coumadin postoperatively; however developed a pulmonary embolism about 8-10 days after her surgery. She was subsequently placed on Lovenox for 1-2 months, but developed hematuria on Lovenox.  CURRENT THERAPY: She recently underwent right knee replacement and was on Lovenox 30 mg twice a day between 03/2012 and 06/2012.  She did not developed any DVT or PE while on prophylactic Lovenox.  She has been off of Lovenox for about 3 weeks now  INTERVAL HISTORY: Kelly Watson 62 y.o. female returns for routine followup with her husband. She reports feeling well since after the surgery and stopping Lovenox.  She has not had leg swelling, chest pain, SOB.  Her right knee pain is much improved compared to prior surgery.  She denied all other symptoms.  The rest of the 14-point review of system was negative.   Past Medical History  Diagnosis Date  . Hypercholesterolemia   . Hypothyroidism   . Hypertension   . Personal history of pulmonary embolism 2011  . Right knee DJD   . Anxiety     Past Surgical History  Procedure Laterality Date  . Knee surgery  2011    left total knee  Pulmonary Embolism post op  . Shoulder surgery      right rotator cuff  . Abdominal hysterectomy      for fibroid  . Thyroidectomy      partial; due to benign thyroid nodule.   . Tubal ligation    . Total knee arthroplasty  04/01/2012    RIGHT KNEE  Dr Bertram Gala  . Total knee arthroplasty  04/01/2012    Procedure: TOTAL KNEE ARTHROPLASTY;  Surgeon: Nilda Simmer, MD;  Location: [facility] OR;  Service: Orthopedics;  Laterality: Right;    Current Outpatient Prescriptions  Medication Sig Dispense Refill  . acetaminophen (TYLENOL) 325 MG tablet Take 2 tablets (650 mg  total) by mouth every 6 (six) hours as needed for pain (or Fever >/= 101).      . calcium-vitamin D (OSCAL WITH D) 500-200 MG-UNIT per tablet Take 1 tablet by mouth daily.      . ferrous sulfate 325 (65 FE) MG tablet Take 2 tablets by mouth daily.      Marland Kitchen levothyroxine (SYNTHROID, LEVOTHROID) 75 MCG tablet Take 75 mcg by mouth daily.      Marland Kitchen LORazepam (ATIVAN) 0.5 MG tablet Take 1 tablet by mouth every 8 (eight) hours as needed. anxiety      . magnesium oxide (MAG-OX) 400 MG tablet Take 800 mg by mouth daily.      . Melatonin 10 MG CAPS Take 1 tablet by mouth at bedtime.      . metoprolol succinate (TOPROL-XL) 50 MG 24 hr tablet Take 25 mg by mouth daily. Take with or immediately following a meal.      . Multiple Vitamin (MULTIVITAMIN) tablet Take 2 tablets by mouth daily.      Marland Kitchen OVER THE COUNTER MEDICATION Calm forte as needed      . OVER THE COUNTER MEDICATION Restful legs as needed      . potassium gluconate (HM POTASSIUM) 595 MG TABS Take 595 mg by mouth 2 (two) times daily.      . simvastatin (ZOCOR) 40 MG tablet Take 40 mg by mouth every  evening.      . SUPER B COMPLEX/C PO Take 2 tablets by mouth daily.        No current facility-administered medications for this visit.    ALLERGIES:  is allergic to aspirin and tramadol.   Filed Vitals:   07/19/12 0929  BP: 141/59  Pulse: 80  Temp: 98.4 F (36.9 C)  Resp: 18   Wt Readings from Last 3 Encounters:  07/19/12 224 lb 1.6 oz (101.651 kg)  04/08/12 236 lb 3.2 oz (107.14 kg)  03/26/12 234 lb 12.6 oz (106.5 kg)   ECOG Performance status: 1  PHYSICAL EXAMINATION:   General:  well-nourished in no acute distress.  Eyes:  no scleral icterus.  ENT:  There were no oropharyngeal lesions.  Neck was without thyromegaly.  Lymphatics:  Negative cervical, supraclavicular or axillary adenopathy.  Respiratory: lungs were clear bilaterally without wheezing or crackles.  Cardiovascular:  Regular rate and rhythm, S1/S2, without murmur, rub or  gallop.  There was no pedal edema.  GI:  abdomen was soft, flat, nontender, nondistended, without organomegaly.  Muscoloskeletal:  no spinal tenderness of palpation of vertebral spine.  Right knee with edema and dressing. Skin exam was without echymosis, petichae.  Neuro exam was nonfocal.  Patient was able to get on and off exam table without assistance.  Patient was alert and oriented.  Attention was good.   Language was appropriate.  Mood was normal without depression.  Speech was not pressured.  Thought content was not tangential.      LABORATORY/RADIOLOGY DATA:  Lab Results  Component Value Date   WBC 6.0 07/19/2012   HGB 12.1 07/19/2012   HCT 37.5 07/19/2012   PLT 199 07/19/2012   GLUCOSE 129* 04/02/2012   CHOL  Value: 134        ATP III CLASSIFICATION:  <200     mg/dL   Desirable  086-578  mg/dL   Borderline High  >=469    mg/dL   High        08/26/5282   TRIG 81 05/19/2009   HDL 43 05/19/2009   LDLCALC  Value: 75        Total Cholesterol/HDL:CHD Risk Coronary Heart Disease Risk Table                     Men   Women  1/2 Average Risk   3.4   3.3  Average Risk       5.0   4.4  2 X Average Risk   9.6   7.1  3 X Average Risk  23.4   11.0        Use the calculated Patient Ratio above and the CHD Risk Table to determine the patient's CHD Risk.        ATP III CLASSIFICATION (LDL):  <100     mg/dL   Optimal  132-440  mg/dL   Near or Above                    Optimal  130-159  mg/dL   Borderline  102-725  mg/dL   High  >366     mg/dL   Very High 4/40/3474   ALKPHOS 64 03/26/2012   ALT 29 03/26/2012   AST 33 03/26/2012   NA 139 04/02/2012   K 4.1 04/02/2012   CL 105 04/02/2012   CREATININE 0.79 04/02/2012   BUN 12 04/02/2012   CO2 25 04/02/2012   INR 1.03 03/26/2012  ASSESSMENT AND PLAN:   1. History of pulmonary embolism with previous orthopedic surgery 2011.  - She recently underwent right knee replacement and was on 3 months of prophylactic Lovenox without thrombotic complication.  Thrombophilia work up  in the past was negative.  She does not need life long anticoagulation since the event in 2011 was provoked.  I advised her to remain off of anticoagulation unless she needs post op prophylaxis.  If she develops an thrombotic event that is not provoked, then she will benefit from life long anticoagulation.   [facility]  Follow up with PCP.    The length of time of the face-to-face encounter was 10 minutes. More than 50% of time was spent counseling and coordination of care.

## 2012-12-11 ENCOUNTER — Other Ambulatory Visit: Payer: Self-pay

## 2012-12-11 DIAGNOSIS — Z1231 Encounter for screening mammogram for malignant neoplasm of breast: Secondary | ICD-10-CM

## 2012-12-27 ENCOUNTER — Ambulatory Visit
Admission: RE | Admit: 2012-12-27 | Discharge: 2012-12-27 | Disposition: A | Discharge status: Home / Self Care | Payer: 59 | Source: Ambulatory Visit

## 2012-12-27 DIAGNOSIS — Z1231 Encounter for screening mammogram for malignant neoplasm of breast: Secondary | ICD-10-CM

## 2013-01-02 ENCOUNTER — Other Ambulatory Visit: Payer: Self-pay

## 2013-02-14 ENCOUNTER — Ambulatory Visit
Admission: RE | Admit: 2013-02-14 | Discharge: 2013-02-14 | Disposition: A | Discharge status: Home / Self Care | Payer: 59 | Source: Ambulatory Visit | Attending: Family Medicine | Admitting: Family Medicine

## 2013-02-14 ENCOUNTER — Other Ambulatory Visit: Payer: Self-pay | Admitting: Family Medicine

## 2013-02-14 MED ORDER — IOHEXOL 300 MG/ML  SOLN
125.0000 mL | Freq: Once | INTRAMUSCULAR | Status: AC | PRN
  Administered 2013-02-14: 125 mL via INTRAVENOUS

## 2013-11-14 ENCOUNTER — Encounter: Payer: Self-pay | Admitting: *Deleted

## 2013-12-02 ENCOUNTER — Other Ambulatory Visit: Payer: Self-pay

## 2013-12-02 DIAGNOSIS — Z1239 Encounter for other screening for malignant neoplasm of breast: Secondary | ICD-10-CM

## 2013-12-29 ENCOUNTER — Other Ambulatory Visit: Payer: Self-pay

## 2013-12-29 ENCOUNTER — Ambulatory Visit
Admission: RE | Admit: 2013-12-29 | Discharge: 2013-12-29 | Disposition: A | Discharge status: Home / Self Care | Payer: 59 | Source: Ambulatory Visit

## 2013-12-29 DIAGNOSIS — Z1231 Encounter for screening mammogram for malignant neoplasm of breast: Secondary | ICD-10-CM

## 2014-08-18 ENCOUNTER — Encounter (HOSPITAL_BASED_OUTPATIENT_CLINIC_OR_DEPARTMENT_OTHER): Payer: Self-pay | Admitting: Physician Assistant

## 2014-08-18 DIAGNOSIS — M65342 Trigger finger, left ring finger: Secondary | ICD-10-CM

## 2014-08-18 HISTORY — DX: Trigger finger, left ring finger: M65.342

## 2014-08-18 NOTE — H&P (Signed)
Kelly Watson is an 64 y.o. female.   Chief Complaint: left ring finger trigger HPI: Kelly Watson is a 64 year old female who comes in today for evaluation of her left fourth finger.  She continues to have active triggering of her left fourth finger.  She had an injection two weeks ago.  It still continues to lock and be painful.  She comes in to discuss surgical intervention.     Past Medical History  Diagnosis Date  . Hypercholesterolemia   . Hypothyroidism   . Hypertension   . Personal history of pulmonary embolism 2011  . Right knee DJD   . Anxiety   . Trigger ring finger of left hand 08/18/2014    Past Surgical History  Procedure Laterality Date  . Knee surgery  2011    left total knee  Pulmonary Embolism post op  . Shoulder surgery      right rotator cuff  . Abdominal hysterectomy      for fibroid  . Thyroidectomy      partial; due to benign thyroid nodule.   . Tubal ligation    . Total knee arthroplasty  04/01/2012    RIGHT KNEE  Dr Dayna Ramus  . Total knee arthroplasty  04/01/2012    Procedure: TOTAL KNEE ARTHROPLASTY;  Surgeon: Lorn Junes, MD;  Location: [place];  Service: Orthopedics;  Laterality: Right;    Family History  Problem Relation Age of Onset  . Asthma Daughter   . Breast cancer Mother   . Breast cancer Maternal Grandmother   . Cancer Brother     prostate   Social History:  reports that she quit smoking about 29 years ago. Her smoking use included Cigarettes. She has a 7.5 pack-year smoking history. She has never used smokeless tobacco. She reports that she does not drink alcohol or use illicit drugs.  Allergies:  Allergies  Allergen Reactions  . Aspirin Hives  . Tramadol     Over sedates    No prescriptions prior to admission    No results found for this or any previous visit (from the past 48 hour(s)). No results found.  Review of Systems  Constitutional: Negative.   HENT: Negative.   Eyes: Negative.   Respiratory: Negative.    Gastrointestinal: Negative.   Genitourinary: Negative.   Musculoskeletal:       Left 4th finger painful catching  Skin: Negative.   Neurological: Negative.   Endo/Heme/Allergies: Negative.   Psychiatric/Behavioral: Negative.     Height 5\' 10"  (1.778 m), weight 108.863 kg (240 lb). Physical Exam  Constitutional: She is oriented to person, place, and time. She appears well-developed and well-nourished.  HENT:  Head: Normocephalic and atraumatic.  Mouth/Throat: Oropharynx is clear and moist.  Eyes: Conjunctivae are normal. Pupils are equal, round, and reactive to light.  Neck: Neck supple.  Cardiovascular: Normal rate.   Respiratory: Effort normal.  GI: Soft.  Genitourinary:  Not pertinent to current symptomatology therefore not examined.  Musculoskeletal:  She continues to have active triggering of her left 4th finger.  She can touch all of her other fingers to her distal palmer crease and she has full extension of all of her fingers on both hands.  She has normal sensation and 2+ radial pulses bilaterally  Neurological: She is alert and oriented to person, place, and time.  Skin: Skin is warm and dry.  Psychiatric: She has a normal mood and affect.     Assessment Principal Problem:   Trigger ring finger of left  hand Active Problems:   Hypercholesterolemia   Thyroid disease   Hypertension   Pulmonary embolism   Hypothyroidism  Plan At this point in time, risks, benefits and possible complications of a trigger finger release were discussed in detail with the patient.  She is without question.    [name] 08/18/2014, 3:24 PM

## 2014-08-25 ENCOUNTER — Ambulatory Visit (HOSPITAL_BASED_OUTPATIENT_CLINIC_OR_DEPARTMENT_OTHER): Payer: 59 | Admitting: Anesthesiology

## 2014-08-25 ENCOUNTER — Ambulatory Visit (HOSPITAL_BASED_OUTPATIENT_CLINIC_OR_DEPARTMENT_OTHER)
Admission: RE | Admit: 2014-08-25 | Discharge: 2014-08-25 | Disposition: A | Discharge status: Home / Self Care | Payer: 59 | Source: Ambulatory Visit | Attending: Orthopedic Surgery | Admitting: Orthopedic Surgery

## 2014-08-25 ENCOUNTER — Encounter (HOSPITAL_BASED_OUTPATIENT_CLINIC_OR_DEPARTMENT_OTHER)
Admission: RE | Disposition: A | Discharge status: Home / Self Care | Payer: Self-pay | Source: Ambulatory Visit | Attending: Orthopedic Surgery

## 2014-08-25 ENCOUNTER — Encounter (HOSPITAL_BASED_OUTPATIENT_CLINIC_OR_DEPARTMENT_OTHER): Payer: Self-pay | Admitting: *Deleted

## 2014-08-25 DIAGNOSIS — E890 Postprocedural hypothyroidism: Secondary | ICD-10-CM | POA: Diagnosis not present

## 2014-08-25 DIAGNOSIS — Z87891 Personal history of nicotine dependence: Secondary | ICD-10-CM | POA: Diagnosis not present

## 2014-08-25 DIAGNOSIS — E7800 Pure hypercholesterolemia, unspecified: Secondary | ICD-10-CM | POA: Diagnosis present

## 2014-08-25 DIAGNOSIS — M65342 Trigger finger, left ring finger: Secondary | ICD-10-CM | POA: Diagnosis present

## 2014-08-25 DIAGNOSIS — I2699 Other pulmonary embolism without acute cor pulmonale: Secondary | ICD-10-CM | POA: Diagnosis not present

## 2014-08-25 DIAGNOSIS — Z96653 Presence of artificial knee joint, bilateral: Secondary | ICD-10-CM | POA: Insufficient documentation

## 2014-08-25 DIAGNOSIS — I10 Essential (primary) hypertension: Secondary | ICD-10-CM | POA: Diagnosis not present

## 2014-08-25 DIAGNOSIS — E079 Disorder of thyroid, unspecified: Secondary | ICD-10-CM | POA: Diagnosis present

## 2014-08-25 DIAGNOSIS — E039 Hypothyroidism, unspecified: Secondary | ICD-10-CM | POA: Diagnosis present

## 2014-08-25 HISTORY — PX: TRIGGER FINGER RELEASE: SHX641

## 2014-08-25 HISTORY — DX: Trigger finger, left ring finger: M65.342

## 2014-08-25 LAB — POCT I-STAT, CHEM 8
BUN: 14 mg/dL (ref 6–20)
CALCIUM ION: 1.18 mmol/L (ref 1.13–1.30)
Chloride: 104 mmol/L (ref 101–111)
Creatinine, Ser: 0.9 mg/dL (ref 0.44–1.00)
Glucose, Bld: 86 mg/dL (ref 65–99)
HEMATOCRIT: 43 % (ref 36.0–46.0)
Hemoglobin: 14.6 g/dL (ref 12.0–15.0)
Potassium: 4.1 mmol/L (ref 3.5–5.1)
Sodium: 141 mmol/L (ref 135–145)
TCO2: 24 mmol/L (ref 0–100)

## 2014-08-25 SURGERY — RELEASE, A1 PULLEY, FOR TRIGGER FINGER
Anesthesia: Monitor Anesthesia Care | Site: Finger | Laterality: Left

## 2014-08-25 MED ORDER — CEFAZOLIN SODIUM-DEXTROSE 2-3 GM-% IV SOLR
INTRAVENOUS | Status: AC
  Filled 2014-08-25: qty 50

## 2014-08-25 MED ORDER — FENTANYL CITRATE (PF) 100 MCG/2ML IJ SOLN
INTRAMUSCULAR | Status: AC
  Filled 2014-08-25: qty 4

## 2014-08-25 MED ORDER — FENTANYL CITRATE (PF) 100 MCG/2ML IJ SOLN
INTRAMUSCULAR | Status: DC | PRN
  Administered 2014-08-25 (×2): 50 ug via INTRAVENOUS

## 2014-08-25 MED ORDER — CEFAZOLIN SODIUM-DEXTROSE 2-3 GM-% IV SOLR
2.0000 g | INTRAVENOUS | Status: AC
  Administered 2014-08-25: 2 g via INTRAVENOUS

## 2014-08-25 MED ORDER — SCOPOLAMINE 1 MG/3DAYS TD PT72: 1.0000 | MEDICATED_PATCH | Freq: Once | TRANSDERMAL | Status: DC | PRN

## 2014-08-25 MED ORDER — BUPIVACAINE HCL (PF) 0.25 % IJ SOLN
INTRAMUSCULAR | Status: DC | PRN
  Administered 2014-08-25: 6 mL

## 2014-08-25 MED ORDER — DEXAMETHASONE SODIUM PHOSPHATE 10 MG/ML IJ SOLN
INTRAMUSCULAR | Status: DC | PRN
  Administered 2014-08-25: 10 mg via INTRAVENOUS

## 2014-08-25 MED ORDER — FENTANYL CITRATE (PF) 100 MCG/2ML IJ SOLN: 25.0000 ug | INTRAMUSCULAR | Status: DC | PRN

## 2014-08-25 MED ORDER — LACTATED RINGERS IV SOLN
INTRAVENOUS | Status: DC
  Administered 2014-08-25: 11:00:00 via INTRAVENOUS

## 2014-08-25 MED ORDER — MIDAZOLAM HCL 5 MG/5ML IJ SOLN
INTRAMUSCULAR | Status: DC | PRN
Start: 2014-08-25 — End: 2014-08-25
  Administered 2014-08-25: 2 mg via INTRAVENOUS

## 2014-08-25 MED ORDER — MIDAZOLAM HCL 2 MG/2ML IJ SOLN
INTRAMUSCULAR | Status: AC
  Filled 2014-08-25: qty 2

## 2014-08-25 MED ORDER — PROPOFOL 10 MG/ML IV BOLUS
INTRAVENOUS | Status: DC | PRN
  Administered 2014-08-25: 200 mg via INTRAVENOUS

## 2014-08-25 MED ORDER — FENTANYL CITRATE (PF) 100 MCG/2ML IJ SOLN: 50.0000 ug | INTRAMUSCULAR | Status: DC | PRN

## 2014-08-25 MED ORDER — GLYCOPYRROLATE 0.2 MG/ML IJ SOLN: 0.2000 mg | Freq: Once | INTRAMUSCULAR | Status: DC | PRN

## 2014-08-25 MED ORDER — HYDROCODONE-ACETAMINOPHEN 5-325 MG PO TABS: 1.0000 | ORAL_TABLET | ORAL | Status: DC | PRN

## 2014-08-25 MED ORDER — MIDAZOLAM HCL 2 MG/2ML IJ SOLN: 1.0000 mg | INTRAMUSCULAR | Status: DC | PRN

## 2014-08-25 MED ORDER — CHLORHEXIDINE GLUCONATE 4 % EX LIQD: 60.0000 mL | Freq: Once | CUTANEOUS | Status: DC

## 2014-08-25 SURGICAL SUPPLY — 45 items
BANDAGE ELASTIC 3 VELCRO ST LF (GAUZE/BANDAGES/DRESSINGS) ×3 IMPLANT
BLADE MINI RND TIP GREEN BEAV (BLADE) IMPLANT
BLADE SURG 15 STRL LF DISP TIS (BLADE) ×2 IMPLANT
BLADE SURG 15 STRL SS (BLADE) ×3
BNDG CMPR 9X4 STRL LF SNTH (GAUZE/BANDAGES/DRESSINGS) ×1
BNDG COHESIVE 4X5 TAN STRL (GAUZE/BANDAGES/DRESSINGS) ×3 IMPLANT
BNDG ESMARK 4X9 LF (GAUZE/BANDAGES/DRESSINGS) ×2 IMPLANT
COVER BACK TABLE 60X90IN (DRAPES) ×3 IMPLANT
CUFF TOURNIQUET SINGLE 18IN (TOURNIQUET CUFF) ×3 IMPLANT
DRAPE EXTREMITY T 121X128X90 (DRAPE) ×3 IMPLANT
DRAPE SURG 17X23 STRL (DRAPES) ×3 IMPLANT
DRAPE U 20/CS (DRAPES) ×3 IMPLANT
DURAPREP 26ML APPLICATOR (WOUND CARE) ×3 IMPLANT
ELECT NDL TIP 2.8 STRL (NEEDLE) IMPLANT
ELECT NEEDLE TIP 2.8 STRL (NEEDLE) IMPLANT
ELECT REM PT RETURN 9FT ADLT (ELECTROSURGICAL)
ELECTRODE REM PT RTRN 9FT ADLT (ELECTROSURGICAL) IMPLANT
GAUZE SPONGE 4X4 12PLY STRL (GAUZE/BANDAGES/DRESSINGS) ×3 IMPLANT
GLOVE BIO SURGEON STRL SZ 6.5 (GLOVE) ×1 IMPLANT
GLOVE BIO SURGEON STRL SZ7 (GLOVE) ×3 IMPLANT
GLOVE BIO SURGEONS STRL SZ 6.5 (GLOVE) ×1
GLOVE BIOGEL PI IND STRL 7.0 (GLOVE) ×1 IMPLANT
GLOVE BIOGEL PI IND STRL 7.5 (GLOVE) ×1 IMPLANT
GLOVE BIOGEL PI INDICATOR 7.0 (GLOVE) ×4
GLOVE BIOGEL PI INDICATOR 7.5 (GLOVE) ×2
GLOVE SS BIOGEL STRL SZ 7.5 (GLOVE) ×1 IMPLANT
GLOVE SUPERSENSE BIOGEL SZ 7.5 (GLOVE) ×2
GOWN STRL REUS W/ TWL LRG LVL3 (GOWN DISPOSABLE) ×3 IMPLANT
GOWN STRL REUS W/TWL LRG LVL3 (GOWN DISPOSABLE) ×9
NDL HYPO 25X1 1.5 SAFETY (NEEDLE) IMPLANT
NEEDLE HYPO 25X1 1.5 SAFETY (NEEDLE) ×3 IMPLANT
NS IRRIG 1000ML POUR BTL (IV SOLUTION) ×3 IMPLANT
PACK BASIN DAY SURGERY FS (CUSTOM PROCEDURE TRAY) ×3 IMPLANT
PAD CAST 3X4 CTTN HI CHSV (CAST SUPPLIES) IMPLANT
PADDING CAST ABS 4INX4YD NS (CAST SUPPLIES) ×2
PADDING CAST ABS COTTON 4X4 ST (CAST SUPPLIES) ×1 IMPLANT
PADDING CAST COTTON 3X4 STRL (CAST SUPPLIES) ×3
PENCIL BUTTON HOLSTER BLD 10FT (ELECTRODE) IMPLANT
SPONGE GAUZE 4X4 12PLY STER LF (GAUZE/BANDAGES/DRESSINGS) ×2 IMPLANT
STOCKINETTE 4X48 STRL (DRAPES) ×3 IMPLANT
SUT ETHILON 4 0 PS 2 18 (SUTURE) ×3 IMPLANT
SYR BULB 3OZ (MISCELLANEOUS) ×3 IMPLANT
SYR CONTROL 10ML LL (SYRINGE) ×2 IMPLANT
TOWEL OR 17X24 6PK STRL BLUE (TOWEL DISPOSABLE) ×6 IMPLANT
UNDERPAD 30X30 (UNDERPADS AND DIAPERS) ×3 IMPLANT

## 2014-08-25 NOTE — Interval H&P Note (Signed)
History and Physical Interval Note:  08/25/2014 12:23 PM  [name]  has presented today for surgery, with the diagnosis of TRIGGER FINGER LEFT RING FINGER  The various methods of treatment have been discussed with the patient and family. After consideration of risks, benefits and other options for treatment, the patient has consented to  Procedure(s): [place] (Left) as Watson surgical intervention .  The patient's history has been reviewed, patient examined, no change in status, stable for surgery.  I have reviewed the patient's chart and labs.  Questions were answered to the patient's satisfaction.     Kelly Watson

## 2014-08-25 NOTE — Anesthesia Postprocedure Evaluation (Signed)
  Anesthesia Post-op Note  Patient: Kelly Watson  Procedure(s) Performed: Procedure(s): LEFT 4TH FINGER TRIGGER RELEASE (Left)  Patient Location: PACU  Anesthesia Type:General  Level of Consciousness: awake  Airway and Oxygen Therapy: Patient Spontanous Breathing  Post-op Pain: mild  Post-op Assessment: Post-op Vital signs reviewed              Post-op Vital Signs: Reviewed  Last Vitals:  Filed Vitals:   08/25/14 1315  BP: 144/58  Pulse: 55  Temp:   Resp: 13    Complications: No apparent anesthesia complications

## 2014-08-25 NOTE — Anesthesia Procedure Notes (Signed)
Procedure Name: LMA Insertion Date/Time: 08/25/2014 12:31 PM Performed by: Toula Moos L Pre-anesthesia Checklist: Patient identified, Emergency Drugs available, Suction available, Patient being monitored and Timeout performed Patient Re-evaluated:Patient Re-evaluated prior to inductionOxygen Delivery Method: Circle System Utilized Preoxygenation: Pre-oxygenation with 100% oxygen Intubation Type: IV induction Ventilation: Mask ventilation without difficulty LMA: LMA inserted LMA Size: 4.0 Number of attempts: 1 Airway Equipment and Method: Bite block Placement Confirmation: positive ETCO2 Tube secured with: Tape Dental Injury: Teeth and Oropharynx as per pre-operative assessment

## 2014-08-25 NOTE — Transfer of Care (Signed)
Immediate Anesthesia Transfer of Care Note  Patient: Kelly Watson  Procedure(s) Performed: Procedure(s): LEFT 4TH FINGER TRIGGER RELEASE (Left)  Patient Location: PACU  Anesthesia Type:General  Level of Consciousness: sedated and patient cooperative  Airway & Oxygen Therapy: Patient Spontanous Breathing and Patient connected to face mask oxygen  Post-op Assessment: Report given to RN and Post -op Vital signs reviewed and stable  Post vital signs: Reviewed and stable  Last Vitals:  Filed Vitals:   08/25/14 1021  BP: 142/59  Pulse: 72  Temp: 37 C  Resp: 18    Complications: No apparent anesthesia complications

## 2014-08-25 NOTE — Discharge Instructions (Signed)

## 2014-08-25 NOTE — Anesthesia Preprocedure Evaluation (Addendum)
Anesthesia Evaluation  Patient identified by MRN, date of birth, ID band Patient awake    Reviewed: Allergy & Precautions, NPO status , Patient's Chart, lab work & pertinent test results  Airway Mallampati: II  TM Distance: >3 FB Neck ROM: Full    Dental   Pulmonary former smoker,  breath sounds clear to auscultation        Cardiovascular hypertension, Rhythm:Regular Rate:Normal     Neuro/Psych    GI/Hepatic negative GI ROS, Neg liver ROS,   Endo/Other  Hypothyroidism   Renal/GU negative Renal ROS     Musculoskeletal   Abdominal   Peds  Hematology   Anesthesia Other Findings   Reproductive/Obstetrics                            Anesthesia Physical Anesthesia Plan  ASA: III  Anesthesia Plan: MAC   Post-op Pain Management:    Induction: Intravenous  Airway Management Planned: Mask and Nasal Cannula  Additional Equipment:   Intra-op Plan:   Post-operative Plan:   Informed Consent: I have reviewed the patients History and Physical, chart, labs and discussed the procedure including the risks, benefits and alternatives for the proposed anesthesia with the patient or authorized representative who has indicated his/her understanding and acceptance.   Dental advisory given  Plan Discussed with: CRNA and Anesthesiologist  Anesthesia Plan Comments:         Anesthesia Quick Evaluation

## 2014-08-26 ENCOUNTER — Encounter (HOSPITAL_BASED_OUTPATIENT_CLINIC_OR_DEPARTMENT_OTHER): Payer: Self-pay | Admitting: Orthopedic Surgery

## 2014-08-26 NOTE — Op Note (Signed)
NAMEGWENDOLYNE, WELFORD NO.:  [id number]  MEDICAL RECORD NO.:  [id number]  LOCATION:                               FACILITY:  [facility]  PHYSICIAN:  Audree Camel. Noemi Chapel, M.D. DATE OF BIRTH:  05/20/1950  DATE OF PROCEDURE:  08/25/2014 DATE OF DISCHARGE:  08/25/2014                              OPERATIVE REPORT   PREOPERATIVE DIAGNOSIS:  Chronic nontraumatic left fourth trigger finger.  POSTOPERATIVE DIAGNOSIS:  Chronic nontraumatic left fourth trigger finger.  PROCEDURE:  Left fourth trigger finger release.  SURGEON:  Elsie Saas, M.D.  ASSISTANT:  Matthew Saras, PA-C  ANESTHESIA:  General.  OPERATIVE TIME:  30 minutes.  COMPLICATIONS:  None.  INDICATION FOR PROCEDURE:  Ms. Dimitroff is a 64 year old woman, who has had painful recurrent left fourth trigger finger over the past year, not responsive to conservative care, and is now to undergo fourth trigger finger release.  DESCRIPTION OF PROCEDURE:  Sherin was brought to the operating room on August 25, 2014, placed on operative table in supine position.  After being placed under general anesthesia, her left hand and arm were prepped using sterile DuraPrep and draped using sterile technique.  Time- out procedure was called and the correct left fourth finger identified. The arm was exsanguinated and a forearm tourniquet elevated to 250 mm. Initially through a 1-1/2 to 2 cm longitudinal incision based over the A1 pulley, initial exposure was made.  The underlying subcutaneous tissues were incised along with skin incision.  Neurovascular structures carefully protected while the A1 pulley was exposed.  It was incised longitudinally revealing underlying flexor tendons, which were intact. Inflammation was noted in tenosynovitis, but no other pathology.  A complete release was achieved, and the finger could be brought through full range of motion with excellent excursion.  The wound was then irrigated and  closed with interrupted 4-0 nylon suture and injected with 0.25% Marcaine.  Sterile dressings were applied, and the patient was awakened and taken to recovery in stable condition.  Needle and sponge counts were correct x2 at the end the case.  FOLLOWUP CARE:  Ms. Tsang will be followed as an outpatient on Norco for pain.  Seen back in office in a week for wound check and followup.      A. Noemi Chapel, M.D.     RAW/MEDQ  D:  08/25/2014  T:  08/26/2014  Job:  [id number]

## 2014-10-08 IMAGING — US US SOFT TISSUE HEAD/NECK
1 series · 14 of 25 positions shown · non-contrast
Comparison: 01/15/2009.

CLINICAL DATA: Right thyroid nodule.  Followup.

THYROID ULTRASOUND
TECHNIQUE: Ultrasound examination of the thyroid gland and adjacent
soft tissues was performed.

[Series 1: us soft tissue head/neck · 0.08mm/px · 14 of 55 slices shown]
[im 1/55]
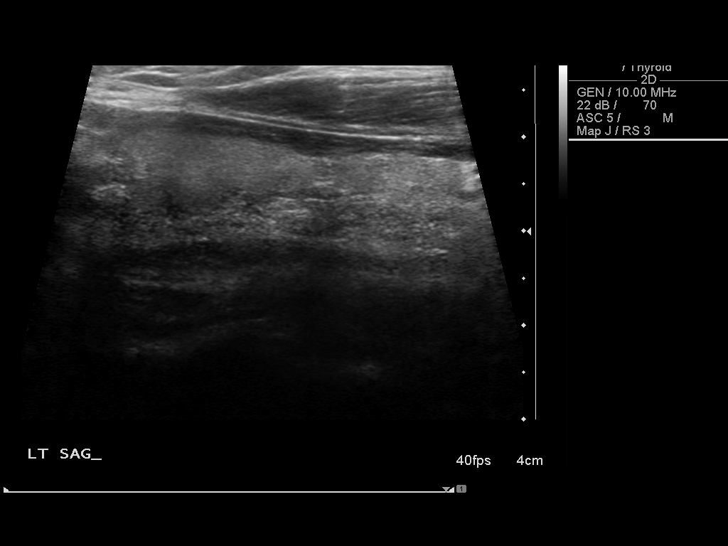
[im 5/55]
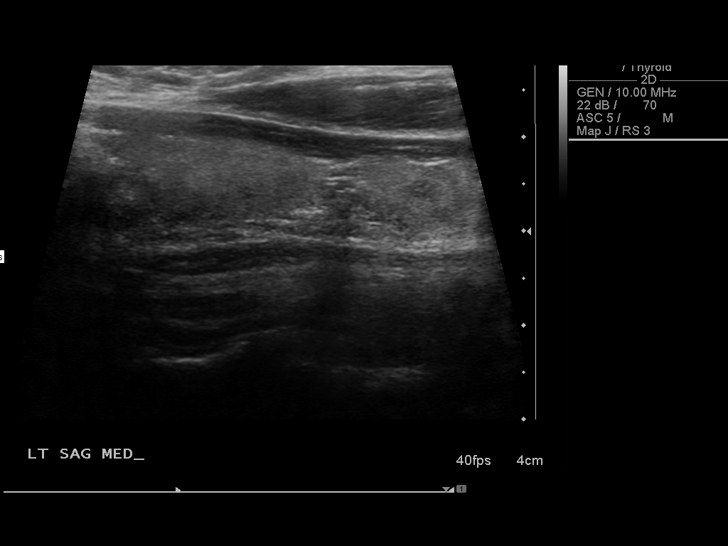
[im 10/55]
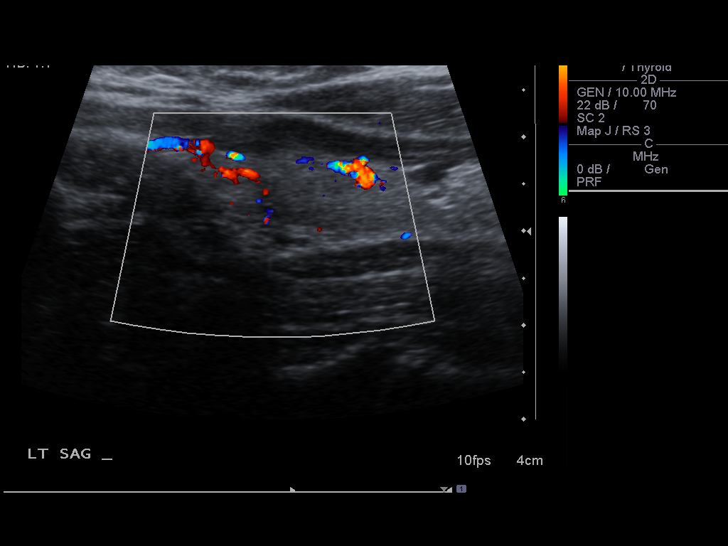
[im 14/55]
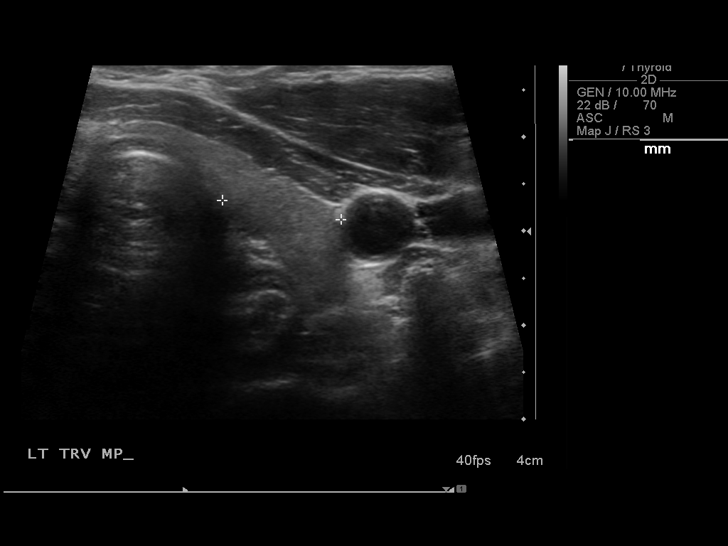
[im 19/55]
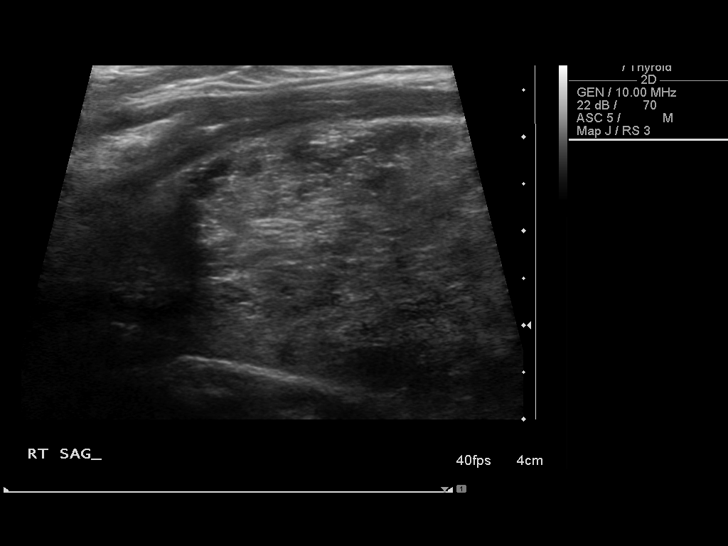
[im 21/55]
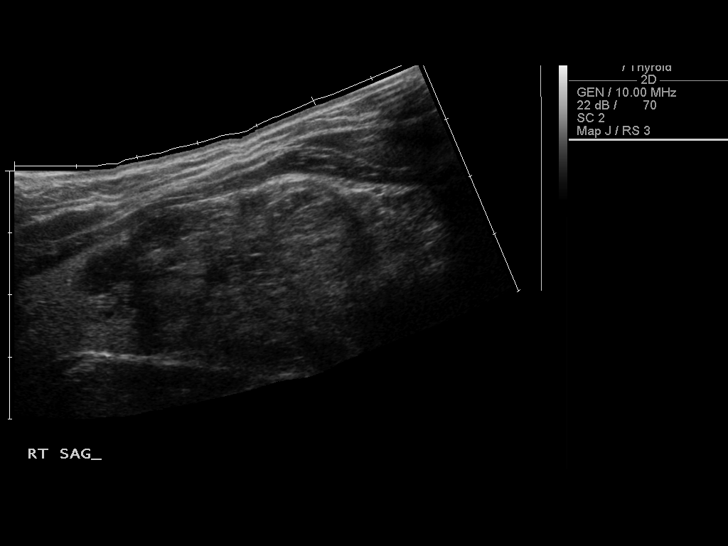
[im 25/55]
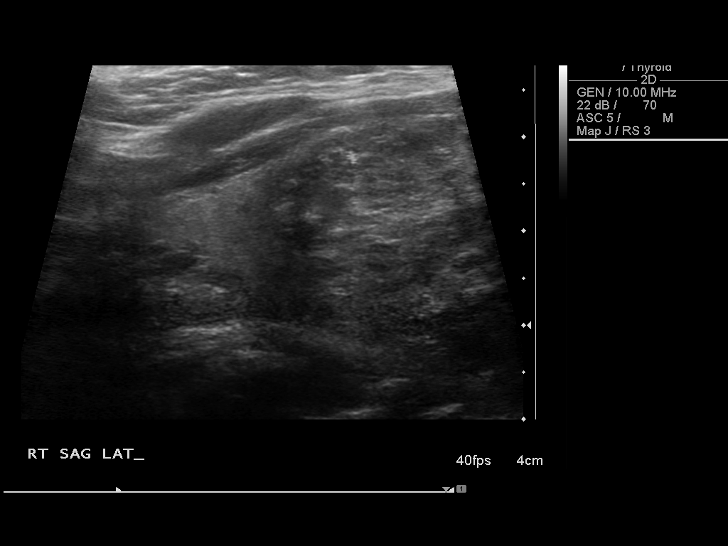
[im 30/55]
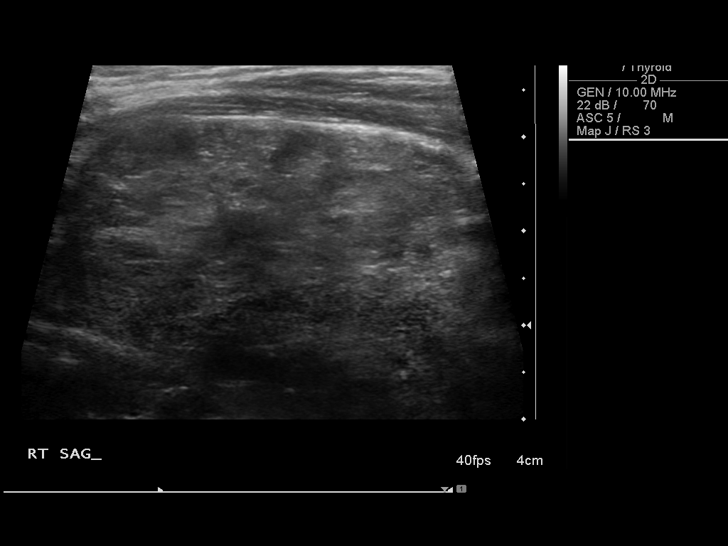
[im 34/55]
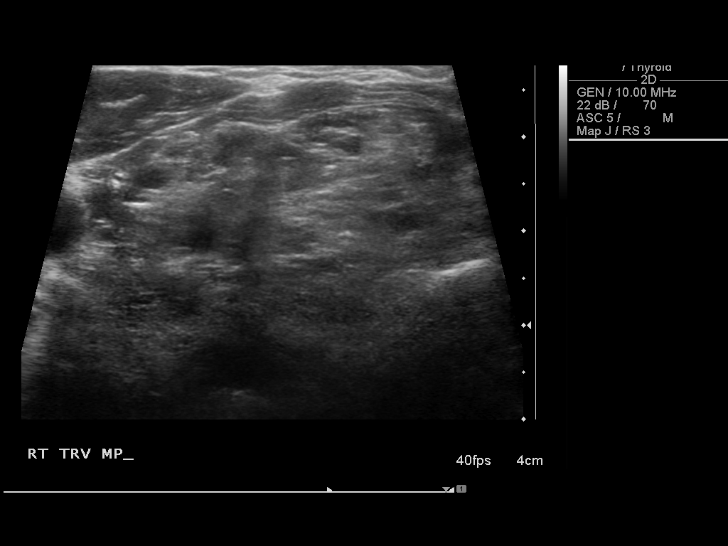
[im 37/55]
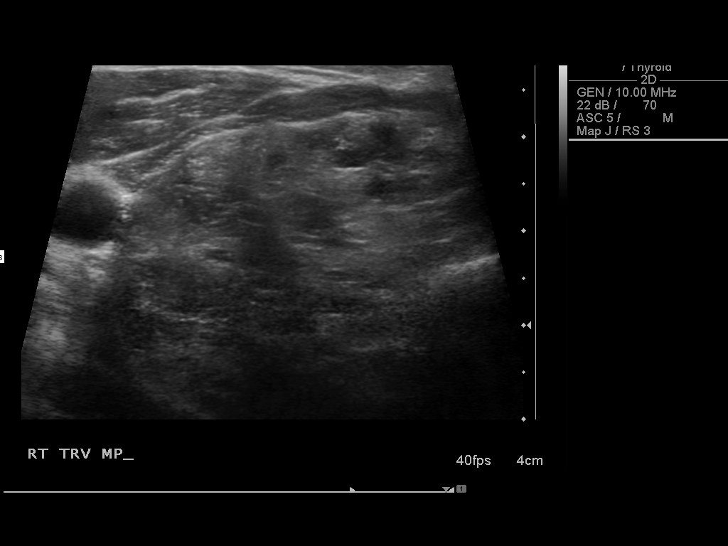
[im 41/55]
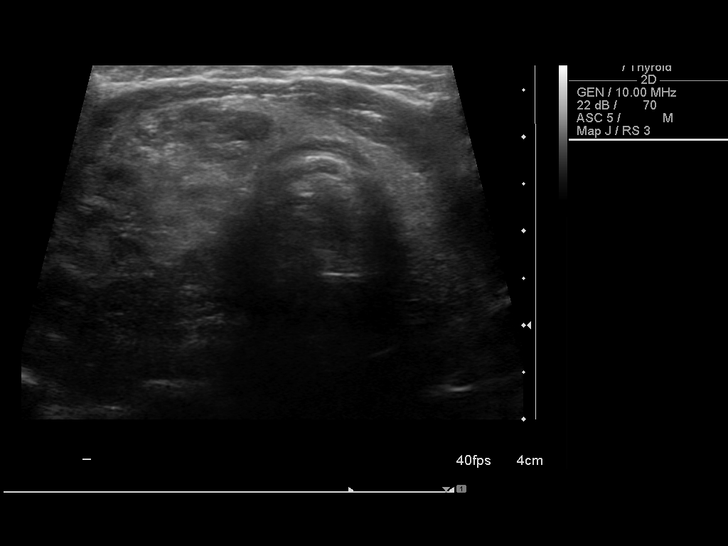
[im 46/55]
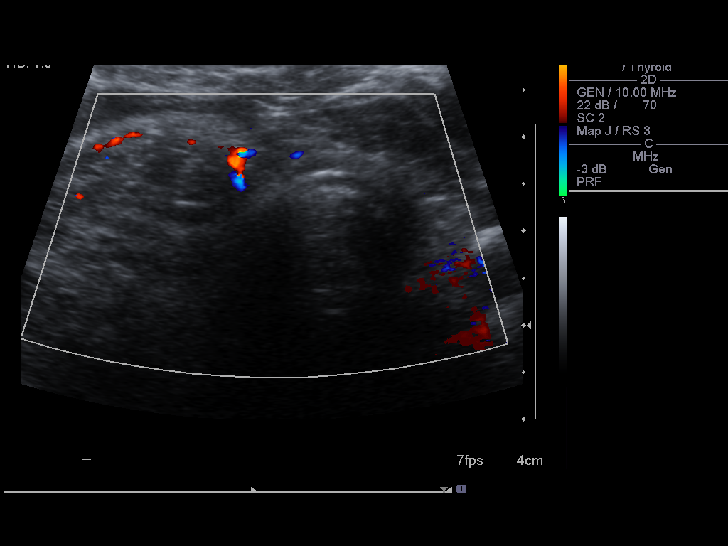
[im 50/55]
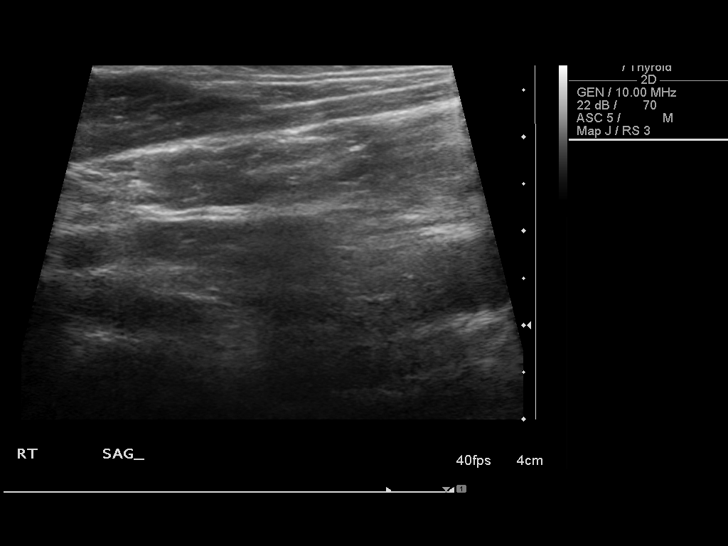
[im 55/55]
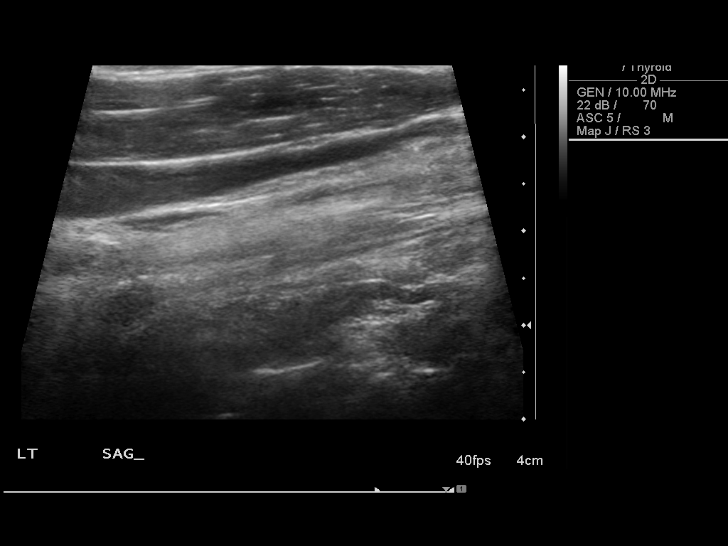

[14 of 25 positions shown; findings below may reference images not displayed]

FINDINGS: Right thyroid lobe:  7.0 x 3.3 x 3.5 cm.
Left thyroid lobe:  3.8 x 0.9 x 1.3 cm.
Isthmus:  4 mm.

Focal nodules:  Large mass involving much of the right thyroid
lobe.  This measures approximately 4.7 x 4.4 x 2.6 cm.  This
previously measured maximally 5.4 cm.  3 mm nodule in the left
lower pole, stable.

Lymphadenopathy:  None visualized.
IMPRESSION: Large heterogeneous mass involving much of the right thyroid lobe,
measuring up to 4.7 cm compared 5.4 cm previously.  This slight
apparent decrease in size may be technical, related to scan planes.
Visually, no significant change.

## 2014-12-30 ENCOUNTER — Other Ambulatory Visit: Payer: Self-pay

## 2014-12-30 DIAGNOSIS — Z1231 Encounter for screening mammogram for malignant neoplasm of breast: Secondary | ICD-10-CM

## 2015-01-14 ENCOUNTER — Ambulatory Visit
Admission: RE | Admit: 2015-01-14 | Discharge: 2015-01-14 | Disposition: A | Discharge status: Home / Self Care | Payer: 59 | Source: Ambulatory Visit

## 2015-01-14 DIAGNOSIS — Z1231 Encounter for screening mammogram for malignant neoplasm of breast: Secondary | ICD-10-CM

## 2015-07-15 DIAGNOSIS — R509 Fever, unspecified: Secondary | ICD-10-CM | POA: Diagnosis not present

## 2015-08-10 DIAGNOSIS — E039 Hypothyroidism, unspecified: Secondary | ICD-10-CM | POA: Diagnosis not present

## 2015-08-10 DIAGNOSIS — E7800 Pure hypercholesterolemia, unspecified: Secondary | ICD-10-CM | POA: Diagnosis not present

## 2015-08-10 DIAGNOSIS — F411 Generalized anxiety disorder: Secondary | ICD-10-CM | POA: Diagnosis not present

## 2015-08-10 DIAGNOSIS — L989 Disorder of the skin and subcutaneous tissue, unspecified: Secondary | ICD-10-CM | POA: Diagnosis not present

## 2015-08-10 DIAGNOSIS — I10 Essential (primary) hypertension: Secondary | ICD-10-CM | POA: Diagnosis not present

## 2015-08-16 DIAGNOSIS — L821 Other seborrheic keratosis: Secondary | ICD-10-CM | POA: Diagnosis not present

## 2015-08-16 DIAGNOSIS — L818 Other specified disorders of pigmentation: Secondary | ICD-10-CM | POA: Diagnosis not present

## 2015-08-16 DIAGNOSIS — D225 Melanocytic nevi of trunk: Secondary | ICD-10-CM | POA: Diagnosis not present

## 2015-08-16 DIAGNOSIS — Z1283 Encounter for screening for malignant neoplasm of skin: Secondary | ICD-10-CM | POA: Diagnosis not present

## 2015-08-25 DIAGNOSIS — S80862A Insect bite (nonvenomous), left lower leg, initial encounter: Secondary | ICD-10-CM | POA: Diagnosis not present

## 2015-09-28 DIAGNOSIS — H43813 Vitreous degeneration, bilateral: Secondary | ICD-10-CM | POA: Diagnosis not present

## 2015-09-28 DIAGNOSIS — H04123 Dry eye syndrome of bilateral lacrimal glands: Secondary | ICD-10-CM | POA: Diagnosis not present

## 2015-09-28 DIAGNOSIS — H2513 Age-related nuclear cataract, bilateral: Secondary | ICD-10-CM | POA: Diagnosis not present

## 2015-11-22 DIAGNOSIS — H43811 Vitreous degeneration, right eye: Secondary | ICD-10-CM | POA: Diagnosis not present

## 2015-11-22 DIAGNOSIS — H2512 Age-related nuclear cataract, left eye: Secondary | ICD-10-CM | POA: Diagnosis not present

## 2015-11-22 DIAGNOSIS — H2511 Age-related nuclear cataract, right eye: Secondary | ICD-10-CM | POA: Diagnosis not present

## 2015-11-22 DIAGNOSIS — H43812 Vitreous degeneration, left eye: Secondary | ICD-10-CM | POA: Diagnosis not present

## 2015-11-30 DIAGNOSIS — S29012A Strain of muscle and tendon of back wall of thorax, initial encounter: Secondary | ICD-10-CM | POA: Diagnosis not present

## 2015-11-30 DIAGNOSIS — Z23 Encounter for immunization: Secondary | ICD-10-CM | POA: Diagnosis not present

## 2015-11-30 DIAGNOSIS — I10 Essential (primary) hypertension: Secondary | ICD-10-CM | POA: Diagnosis not present

## 2016-01-26 ENCOUNTER — Other Ambulatory Visit: Payer: Self-pay | Admitting: Family Medicine

## 2016-01-26 DIAGNOSIS — Z1231 Encounter for screening mammogram for malignant neoplasm of breast: Secondary | ICD-10-CM

## 2016-01-28 DIAGNOSIS — N644 Mastodynia: Secondary | ICD-10-CM | POA: Diagnosis not present

## 2016-01-28 DIAGNOSIS — K5900 Constipation, unspecified: Secondary | ICD-10-CM | POA: Diagnosis not present

## 2016-02-02 ENCOUNTER — Other Ambulatory Visit: Payer: Self-pay | Admitting: Family Medicine

## 2016-02-02 DIAGNOSIS — N644 Mastodynia: Secondary | ICD-10-CM

## 2016-02-08 ENCOUNTER — Ambulatory Visit
Admission: RE | Admit: 2016-02-08 | Discharge: 2016-02-08 | Disposition: A | Discharge status: Home / Self Care | Payer: PPO | Source: Ambulatory Visit | Attending: Family Medicine | Admitting: Family Medicine

## 2016-02-08 DIAGNOSIS — N644 Mastodynia: Secondary | ICD-10-CM

## 2016-02-14 DIAGNOSIS — J0100 Acute maxillary sinusitis, unspecified: Secondary | ICD-10-CM | POA: Diagnosis not present

## 2016-03-14 ENCOUNTER — Other Ambulatory Visit: Payer: Self-pay | Admitting: Family Medicine

## 2016-03-14 DIAGNOSIS — Z0000 Encounter for general adult medical examination without abnormal findings: Secondary | ICD-10-CM | POA: Diagnosis not present

## 2016-03-14 DIAGNOSIS — E039 Hypothyroidism, unspecified: Secondary | ICD-10-CM | POA: Diagnosis not present

## 2016-03-14 DIAGNOSIS — E049 Nontoxic goiter, unspecified: Secondary | ICD-10-CM

## 2016-03-14 DIAGNOSIS — Z1159 Encounter for screening for other viral diseases: Secondary | ICD-10-CM | POA: Diagnosis not present

## 2016-03-14 DIAGNOSIS — M79632 Pain in left forearm: Secondary | ICD-10-CM | POA: Diagnosis not present

## 2016-03-14 DIAGNOSIS — I10 Essential (primary) hypertension: Secondary | ICD-10-CM | POA: Diagnosis not present

## 2016-03-14 DIAGNOSIS — F411 Generalized anxiety disorder: Secondary | ICD-10-CM | POA: Diagnosis not present

## 2016-03-14 DIAGNOSIS — E7800 Pure hypercholesterolemia, unspecified: Secondary | ICD-10-CM | POA: Diagnosis not present

## 2016-03-14 DIAGNOSIS — Z23 Encounter for immunization: Secondary | ICD-10-CM | POA: Diagnosis not present

## 2016-03-15 DIAGNOSIS — Z1211 Encounter for screening for malignant neoplasm of colon: Secondary | ICD-10-CM | POA: Diagnosis not present

## 2016-03-20 ENCOUNTER — Ambulatory Visit
Admission: RE | Admit: 2016-03-20 | Discharge: 2016-03-20 | Disposition: A | Discharge status: Home / Self Care | Payer: PPO | Source: Ambulatory Visit | Attending: Family Medicine | Admitting: Family Medicine

## 2016-03-20 DIAGNOSIS — E049 Nontoxic goiter, unspecified: Secondary | ICD-10-CM

## 2016-03-20 DIAGNOSIS — E042 Nontoxic multinodular goiter: Secondary | ICD-10-CM | POA: Diagnosis not present

## 2016-04-28 ENCOUNTER — Other Ambulatory Visit: Payer: Self-pay | Admitting: Family Medicine

## 2016-04-28 DIAGNOSIS — E041 Nontoxic single thyroid nodule: Secondary | ICD-10-CM

## 2016-05-02 ENCOUNTER — Other Ambulatory Visit (HOSPITAL_COMMUNITY)
Admission: RE | Admit: 2016-05-02 | Discharge: 2016-05-02 | Disposition: A | Discharge status: Home / Self Care | Payer: PPO | Source: Ambulatory Visit | Attending: General Surgery | Admitting: General Surgery

## 2016-05-02 ENCOUNTER — Ambulatory Visit
Admission: RE | Admit: 2016-05-02 | Discharge: 2016-05-02 | Disposition: A | Discharge status: Home / Self Care | Payer: PPO | Source: Ambulatory Visit | Attending: Family Medicine | Admitting: Family Medicine

## 2016-05-02 DIAGNOSIS — E041 Nontoxic single thyroid nodule: Secondary | ICD-10-CM

## 2016-07-25 DIAGNOSIS — M5136 Other intervertebral disc degeneration, lumbar region: Secondary | ICD-10-CM | POA: Diagnosis not present

## 2016-07-31 DIAGNOSIS — M5126 Other intervertebral disc displacement, lumbar region: Secondary | ICD-10-CM | POA: Diagnosis not present

## 2016-07-31 DIAGNOSIS — M545 Low back pain: Secondary | ICD-10-CM | POA: Diagnosis not present

## 2016-07-31 DIAGNOSIS — M5416 Radiculopathy, lumbar region: Secondary | ICD-10-CM | POA: Diagnosis not present

## 2016-08-03 DIAGNOSIS — M5416 Radiculopathy, lumbar region: Secondary | ICD-10-CM | POA: Diagnosis not present

## 2016-08-03 DIAGNOSIS — M545 Low back pain: Secondary | ICD-10-CM | POA: Diagnosis not present

## 2016-08-03 DIAGNOSIS — M5126 Other intervertebral disc displacement, lumbar region: Secondary | ICD-10-CM | POA: Diagnosis not present

## 2016-08-07 DIAGNOSIS — M5416 Radiculopathy, lumbar region: Secondary | ICD-10-CM | POA: Diagnosis not present

## 2016-08-07 DIAGNOSIS — M5126 Other intervertebral disc displacement, lumbar region: Secondary | ICD-10-CM | POA: Diagnosis not present

## 2016-08-07 DIAGNOSIS — M545 Low back pain: Secondary | ICD-10-CM | POA: Diagnosis not present

## 2016-08-08 DIAGNOSIS — M5126 Other intervertebral disc displacement, lumbar region: Secondary | ICD-10-CM | POA: Diagnosis not present

## 2016-08-08 DIAGNOSIS — M545 Low back pain: Secondary | ICD-10-CM | POA: Diagnosis not present

## 2016-08-08 DIAGNOSIS — M5416 Radiculopathy, lumbar region: Secondary | ICD-10-CM | POA: Diagnosis not present

## 2016-08-08 DIAGNOSIS — M5136 Other intervertebral disc degeneration, lumbar region: Secondary | ICD-10-CM | POA: Diagnosis not present

## 2016-08-15 DIAGNOSIS — M5126 Other intervertebral disc displacement, lumbar region: Secondary | ICD-10-CM | POA: Diagnosis not present

## 2016-08-15 DIAGNOSIS — M545 Low back pain: Secondary | ICD-10-CM | POA: Diagnosis not present

## 2016-08-15 DIAGNOSIS — M5416 Radiculopathy, lumbar region: Secondary | ICD-10-CM | POA: Diagnosis not present

## 2016-08-17 DIAGNOSIS — M545 Low back pain: Secondary | ICD-10-CM | POA: Diagnosis not present

## 2016-08-17 DIAGNOSIS — M5416 Radiculopathy, lumbar region: Secondary | ICD-10-CM | POA: Diagnosis not present

## 2016-08-17 DIAGNOSIS — M5126 Other intervertebral disc displacement, lumbar region: Secondary | ICD-10-CM | POA: Diagnosis not present

## 2016-08-22 DIAGNOSIS — M5416 Radiculopathy, lumbar region: Secondary | ICD-10-CM | POA: Diagnosis not present

## 2016-08-22 DIAGNOSIS — M545 Low back pain: Secondary | ICD-10-CM | POA: Diagnosis not present

## 2016-08-22 DIAGNOSIS — M5126 Other intervertebral disc displacement, lumbar region: Secondary | ICD-10-CM | POA: Diagnosis not present

## 2016-08-24 DIAGNOSIS — M5126 Other intervertebral disc displacement, lumbar region: Secondary | ICD-10-CM | POA: Diagnosis not present

## 2016-08-24 DIAGNOSIS — M5416 Radiculopathy, lumbar region: Secondary | ICD-10-CM | POA: Diagnosis not present

## 2016-08-24 DIAGNOSIS — M545 Low back pain: Secondary | ICD-10-CM | POA: Diagnosis not present

## 2016-08-29 DIAGNOSIS — M545 Low back pain: Secondary | ICD-10-CM | POA: Diagnosis not present

## 2016-08-29 DIAGNOSIS — M5126 Other intervertebral disc displacement, lumbar region: Secondary | ICD-10-CM | POA: Diagnosis not present

## 2016-08-29 DIAGNOSIS — M5416 Radiculopathy, lumbar region: Secondary | ICD-10-CM | POA: Diagnosis not present

## 2016-09-13 DIAGNOSIS — E7800 Pure hypercholesterolemia, unspecified: Secondary | ICD-10-CM | POA: Diagnosis not present

## 2016-09-13 DIAGNOSIS — E039 Hypothyroidism, unspecified: Secondary | ICD-10-CM | POA: Diagnosis not present

## 2016-09-13 DIAGNOSIS — I10 Essential (primary) hypertension: Secondary | ICD-10-CM | POA: Diagnosis not present

## 2016-09-19 DIAGNOSIS — M5126 Other intervertebral disc displacement, lumbar region: Secondary | ICD-10-CM | POA: Diagnosis not present

## 2016-09-19 DIAGNOSIS — M545 Low back pain: Secondary | ICD-10-CM | POA: Diagnosis not present

## 2016-09-19 DIAGNOSIS — M5416 Radiculopathy, lumbar region: Secondary | ICD-10-CM | POA: Diagnosis not present

## 2016-09-29 DIAGNOSIS — L739 Follicular disorder, unspecified: Secondary | ICD-10-CM | POA: Diagnosis not present

## 2016-10-16 DIAGNOSIS — M545 Low back pain: Secondary | ICD-10-CM | POA: Diagnosis not present

## 2016-10-16 DIAGNOSIS — M5126 Other intervertebral disc displacement, lumbar region: Secondary | ICD-10-CM | POA: Diagnosis not present

## 2016-10-16 DIAGNOSIS — M5416 Radiculopathy, lumbar region: Secondary | ICD-10-CM | POA: Diagnosis not present

## 2016-11-09 DIAGNOSIS — M545 Low back pain: Secondary | ICD-10-CM | POA: Diagnosis not present

## 2016-11-09 DIAGNOSIS — M542 Cervicalgia: Secondary | ICD-10-CM | POA: Diagnosis not present

## 2016-11-09 DIAGNOSIS — M5136 Other intervertebral disc degeneration, lumbar region: Secondary | ICD-10-CM | POA: Diagnosis not present

## 2016-11-09 DIAGNOSIS — M5416 Radiculopathy, lumbar region: Secondary | ICD-10-CM | POA: Diagnosis not present

## 2016-11-22 DIAGNOSIS — M545 Low back pain: Secondary | ICD-10-CM | POA: Diagnosis not present

## 2016-11-22 DIAGNOSIS — M5416 Radiculopathy, lumbar region: Secondary | ICD-10-CM | POA: Diagnosis not present

## 2016-11-22 DIAGNOSIS — M5136 Other intervertebral disc degeneration, lumbar region: Secondary | ICD-10-CM | POA: Diagnosis not present

## 2016-12-05 DIAGNOSIS — Z23 Encounter for immunization: Secondary | ICD-10-CM | POA: Diagnosis not present

## 2016-12-12 DIAGNOSIS — M5416 Radiculopathy, lumbar region: Secondary | ICD-10-CM | POA: Diagnosis not present

## 2016-12-12 DIAGNOSIS — M48061 Spinal stenosis, lumbar region without neurogenic claudication: Secondary | ICD-10-CM | POA: Diagnosis not present

## 2016-12-12 DIAGNOSIS — M5136 Other intervertebral disc degeneration, lumbar region: Secondary | ICD-10-CM | POA: Diagnosis not present

## 2016-12-12 DIAGNOSIS — M545 Low back pain: Secondary | ICD-10-CM | POA: Diagnosis not present

## 2016-12-19 DIAGNOSIS — M5416 Radiculopathy, lumbar region: Secondary | ICD-10-CM | POA: Diagnosis not present

## 2016-12-19 DIAGNOSIS — M7062 Trochanteric bursitis, left hip: Secondary | ICD-10-CM | POA: Diagnosis not present

## 2016-12-19 DIAGNOSIS — M545 Low back pain: Secondary | ICD-10-CM | POA: Diagnosis not present

## 2016-12-21 DIAGNOSIS — M7062 Trochanteric bursitis, left hip: Secondary | ICD-10-CM | POA: Diagnosis not present

## 2016-12-21 DIAGNOSIS — M5416 Radiculopathy, lumbar region: Secondary | ICD-10-CM | POA: Diagnosis not present

## 2016-12-21 DIAGNOSIS — M545 Low back pain: Secondary | ICD-10-CM | POA: Diagnosis not present

## 2016-12-26 DIAGNOSIS — M7062 Trochanteric bursitis, left hip: Secondary | ICD-10-CM | POA: Diagnosis not present

## 2016-12-26 DIAGNOSIS — M545 Low back pain: Secondary | ICD-10-CM | POA: Diagnosis not present

## 2016-12-26 DIAGNOSIS — M5416 Radiculopathy, lumbar region: Secondary | ICD-10-CM | POA: Diagnosis not present

## 2016-12-28 DIAGNOSIS — M545 Low back pain: Secondary | ICD-10-CM | POA: Diagnosis not present

## 2016-12-28 DIAGNOSIS — M7062 Trochanteric bursitis, left hip: Secondary | ICD-10-CM | POA: Diagnosis not present

## 2016-12-28 DIAGNOSIS — M5416 Radiculopathy, lumbar region: Secondary | ICD-10-CM | POA: Diagnosis not present

## 2017-01-02 DIAGNOSIS — M5416 Radiculopathy, lumbar region: Secondary | ICD-10-CM | POA: Diagnosis not present

## 2017-01-02 DIAGNOSIS — M545 Low back pain: Secondary | ICD-10-CM | POA: Diagnosis not present

## 2017-01-02 DIAGNOSIS — M7062 Trochanteric bursitis, left hip: Secondary | ICD-10-CM | POA: Diagnosis not present

## 2017-01-04 ENCOUNTER — Other Ambulatory Visit: Payer: Self-pay | Admitting: Family Medicine

## 2017-01-04 DIAGNOSIS — Z1231 Encounter for screening mammogram for malignant neoplasm of breast: Secondary | ICD-10-CM

## 2017-01-09 DIAGNOSIS — M545 Low back pain: Secondary | ICD-10-CM | POA: Diagnosis not present

## 2017-01-09 DIAGNOSIS — M5416 Radiculopathy, lumbar region: Secondary | ICD-10-CM | POA: Diagnosis not present

## 2017-01-09 DIAGNOSIS — M48061 Spinal stenosis, lumbar region without neurogenic claudication: Secondary | ICD-10-CM | POA: Diagnosis not present

## 2017-01-11 DIAGNOSIS — M7062 Trochanteric bursitis, left hip: Secondary | ICD-10-CM | POA: Diagnosis not present

## 2017-01-11 DIAGNOSIS — M545 Low back pain: Secondary | ICD-10-CM | POA: Diagnosis not present

## 2017-01-11 DIAGNOSIS — M5416 Radiculopathy, lumbar region: Secondary | ICD-10-CM | POA: Diagnosis not present

## 2017-01-16 DIAGNOSIS — M545 Low back pain: Secondary | ICD-10-CM | POA: Diagnosis not present

## 2017-01-16 DIAGNOSIS — M5416 Radiculopathy, lumbar region: Secondary | ICD-10-CM | POA: Diagnosis not present

## 2017-01-16 DIAGNOSIS — M7062 Trochanteric bursitis, left hip: Secondary | ICD-10-CM | POA: Diagnosis not present

## 2017-01-23 DIAGNOSIS — M5416 Radiculopathy, lumbar region: Secondary | ICD-10-CM | POA: Diagnosis not present

## 2017-01-23 DIAGNOSIS — M7062 Trochanteric bursitis, left hip: Secondary | ICD-10-CM | POA: Diagnosis not present

## 2017-01-23 DIAGNOSIS — M545 Low back pain: Secondary | ICD-10-CM | POA: Diagnosis not present

## 2017-01-25 DIAGNOSIS — M5416 Radiculopathy, lumbar region: Secondary | ICD-10-CM | POA: Diagnosis not present

## 2017-01-25 DIAGNOSIS — M7062 Trochanteric bursitis, left hip: Secondary | ICD-10-CM | POA: Diagnosis not present

## 2017-01-25 DIAGNOSIS — M545 Low back pain: Secondary | ICD-10-CM | POA: Diagnosis not present

## 2017-02-08 ENCOUNTER — Ambulatory Visit
Admission: RE | Admit: 2017-02-08 | Discharge: 2017-02-08 | Disposition: A | Discharge status: Home / Self Care | Payer: PPO | Source: Ambulatory Visit | Attending: Family Medicine | Admitting: Family Medicine

## 2017-02-08 DIAGNOSIS — Z1231 Encounter for screening mammogram for malignant neoplasm of breast: Secondary | ICD-10-CM

## 2017-03-21 DIAGNOSIS — M8588 Other specified disorders of bone density and structure, other site: Secondary | ICD-10-CM | POA: Diagnosis not present

## 2017-03-21 DIAGNOSIS — I10 Essential (primary) hypertension: Secondary | ICD-10-CM | POA: Diagnosis not present

## 2017-03-21 DIAGNOSIS — E039 Hypothyroidism, unspecified: Secondary | ICD-10-CM | POA: Diagnosis not present

## 2017-03-21 DIAGNOSIS — Z0000 Encounter for general adult medical examination without abnormal findings: Secondary | ICD-10-CM | POA: Diagnosis not present

## 2017-03-21 DIAGNOSIS — F411 Generalized anxiety disorder: Secondary | ICD-10-CM | POA: Diagnosis not present

## 2017-03-21 DIAGNOSIS — E7800 Pure hypercholesterolemia, unspecified: Secondary | ICD-10-CM | POA: Diagnosis not present

## 2017-03-23 ENCOUNTER — Other Ambulatory Visit: Payer: Self-pay | Admitting: Family Medicine

## 2017-03-23 DIAGNOSIS — M8580 Other specified disorders of bone density and structure, unspecified site: Secondary | ICD-10-CM

## 2017-04-10 ENCOUNTER — Ambulatory Visit
Admission: RE | Admit: 2017-04-10 | Discharge: 2017-04-10 | Disposition: A | Discharge status: Home / Self Care | Payer: PPO | Source: Ambulatory Visit | Attending: Family Medicine | Admitting: Family Medicine

## 2017-04-10 DIAGNOSIS — Z780 Asymptomatic menopausal state: Secondary | ICD-10-CM | POA: Diagnosis not present

## 2017-04-10 DIAGNOSIS — M8580 Other specified disorders of bone density and structure, unspecified site: Secondary | ICD-10-CM

## 2017-04-10 DIAGNOSIS — M85851 Other specified disorders of bone density and structure, right thigh: Secondary | ICD-10-CM | POA: Diagnosis not present

## 2017-08-01 DIAGNOSIS — H40033 Anatomical narrow angle, bilateral: Secondary | ICD-10-CM | POA: Diagnosis not present

## 2017-08-01 DIAGNOSIS — H04123 Dry eye syndrome of bilateral lacrimal glands: Secondary | ICD-10-CM | POA: Diagnosis not present

## 2017-10-25 DIAGNOSIS — M5416 Radiculopathy, lumbar region: Secondary | ICD-10-CM | POA: Diagnosis not present

## 2017-10-25 DIAGNOSIS — M545 Low back pain: Secondary | ICD-10-CM | POA: Diagnosis not present

## 2017-12-05 DIAGNOSIS — Z23 Encounter for immunization: Secondary | ICD-10-CM | POA: Diagnosis not present

## 2018-01-08 ENCOUNTER — Other Ambulatory Visit: Payer: Self-pay | Admitting: Family Medicine

## 2018-01-08 DIAGNOSIS — Z1231 Encounter for screening mammogram for malignant neoplasm of breast: Secondary | ICD-10-CM

## 2018-01-28 DIAGNOSIS — M48061 Spinal stenosis, lumbar region without neurogenic claudication: Secondary | ICD-10-CM | POA: Diagnosis not present

## 2018-01-28 DIAGNOSIS — M545 Low back pain: Secondary | ICD-10-CM | POA: Diagnosis not present

## 2018-01-28 DIAGNOSIS — M5416 Radiculopathy, lumbar region: Secondary | ICD-10-CM | POA: Diagnosis not present

## 2018-02-18 ENCOUNTER — Ambulatory Visit
Admission: RE | Admit: 2018-02-18 | Discharge: 2018-02-18 | Disposition: A | Discharge status: Home / Self Care | Payer: PPO | Source: Ambulatory Visit | Attending: Family Medicine | Admitting: Family Medicine

## 2018-02-18 DIAGNOSIS — Z1231 Encounter for screening mammogram for malignant neoplasm of breast: Secondary | ICD-10-CM | POA: Diagnosis not present

## 2018-04-03 DIAGNOSIS — Z0000 Encounter for general adult medical examination without abnormal findings: Secondary | ICD-10-CM | POA: Diagnosis not present

## 2018-04-03 DIAGNOSIS — E039 Hypothyroidism, unspecified: Secondary | ICD-10-CM | POA: Diagnosis not present

## 2018-04-03 DIAGNOSIS — I10 Essential (primary) hypertension: Secondary | ICD-10-CM | POA: Diagnosis not present

## 2018-04-03 DIAGNOSIS — E7800 Pure hypercholesterolemia, unspecified: Secondary | ICD-10-CM | POA: Diagnosis not present

## 2018-04-03 DIAGNOSIS — F411 Generalized anxiety disorder: Secondary | ICD-10-CM | POA: Diagnosis not present

## 2018-04-03 DIAGNOSIS — M1990 Unspecified osteoarthritis, unspecified site: Secondary | ICD-10-CM | POA: Diagnosis not present

## 2018-04-14 IMAGING — US US THYROID BIOPSY
1 series · 13 of 14 positions shown · non-contrast
Comparison: Ultrasound on March 20, 2016

MEDICATIONS:
1% lidocaine

COMPLICATIONS:
None immediate.

INDICATION: Indeterminate thyroid nodule in the right thyroid lobe.

EXAM:
ULTRASOUND GUIDED FINE NEEDLE ASPIRATION OF INDETERMINATE THYROID
NODULE
TECHNIQUE: Informed written consent was obtained from the patient after a
discussion of the risks, benefits and alternatives to treatment.
Questions regarding the procedure were encouraged and answered. A
timeout was performed prior to the initiation of the procedure.

[Series 1: us thyroid biopsy · 0.07mm/px · 14 acquisitions, 13 frames shown]
[im 1/14]
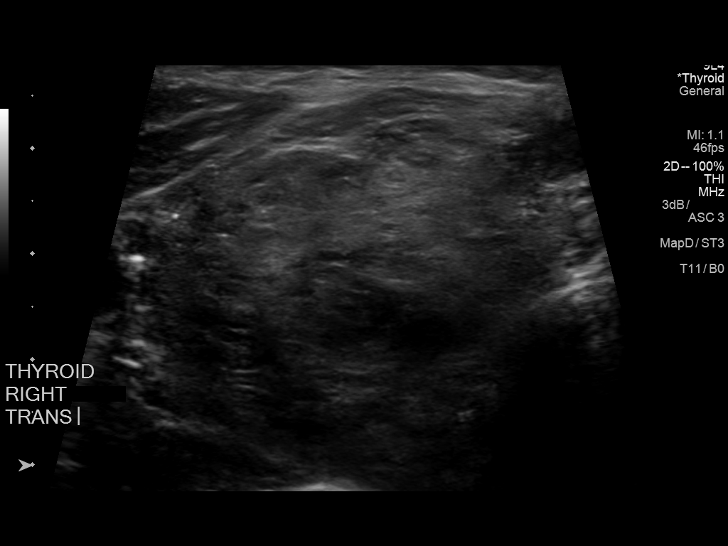
[im 2/14]
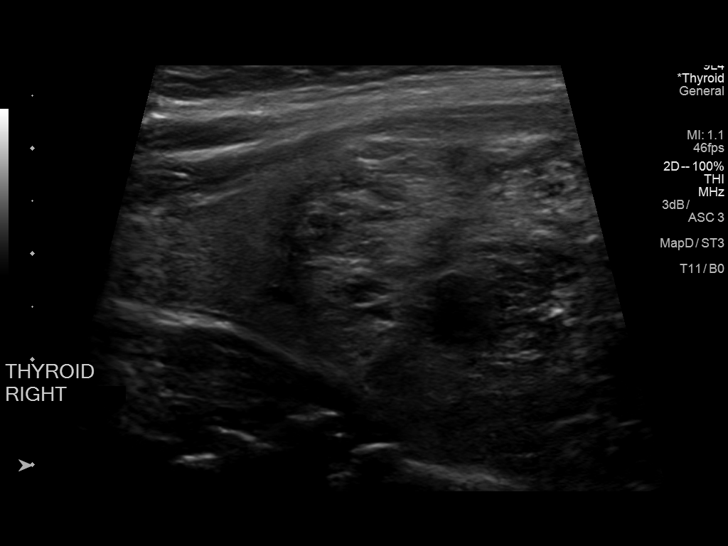
[im 3/14]
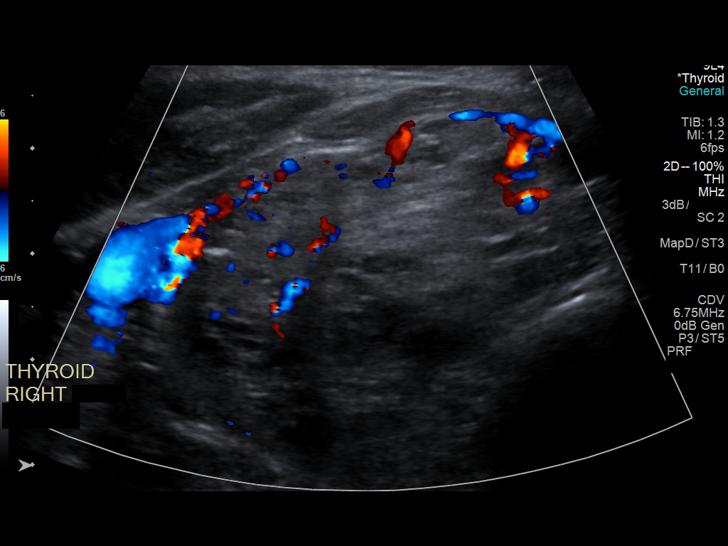
[im 4/14]
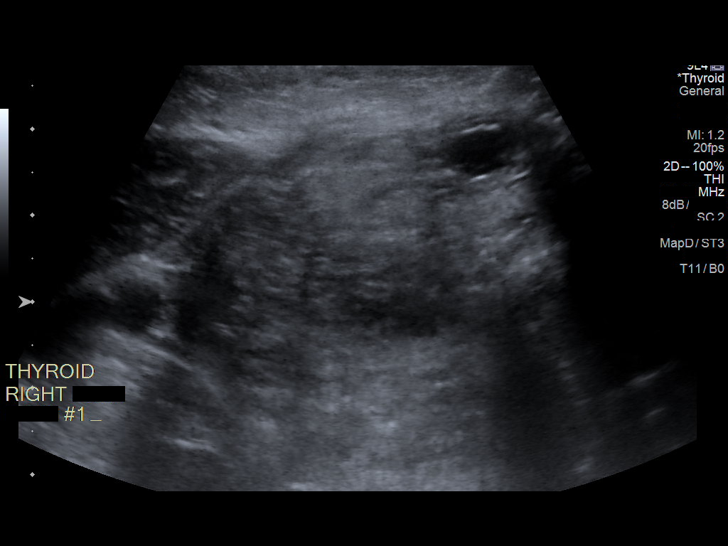
[im 5/14]
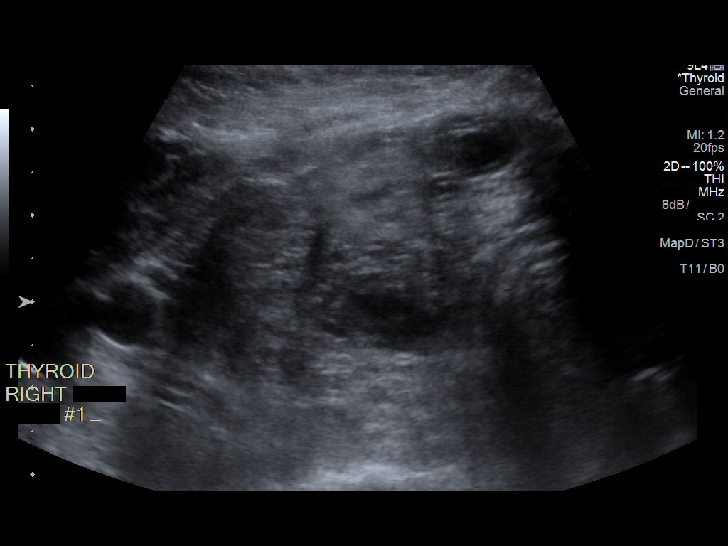
[im 6/14]
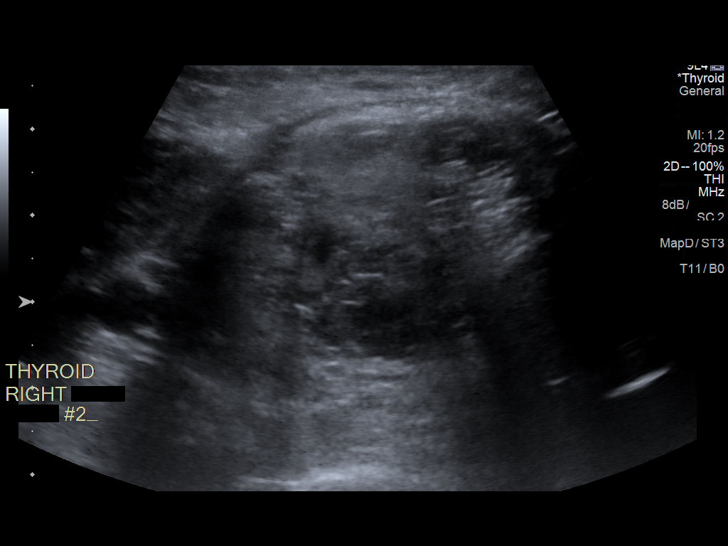
[im 8/14]
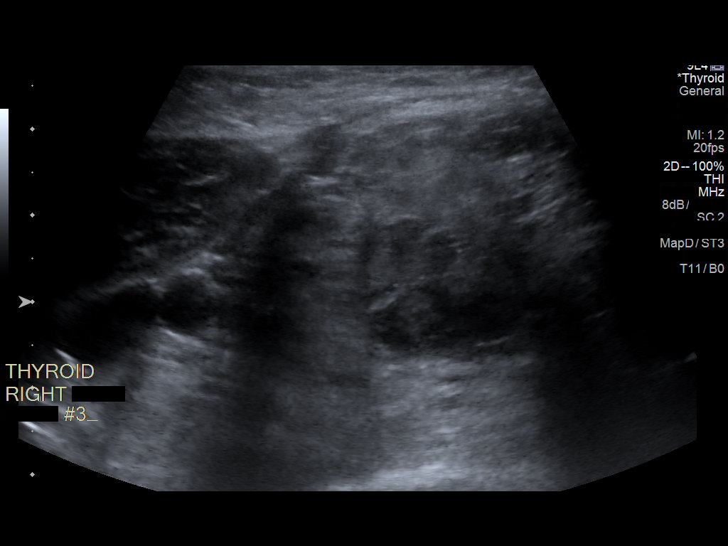
[im 9/14]
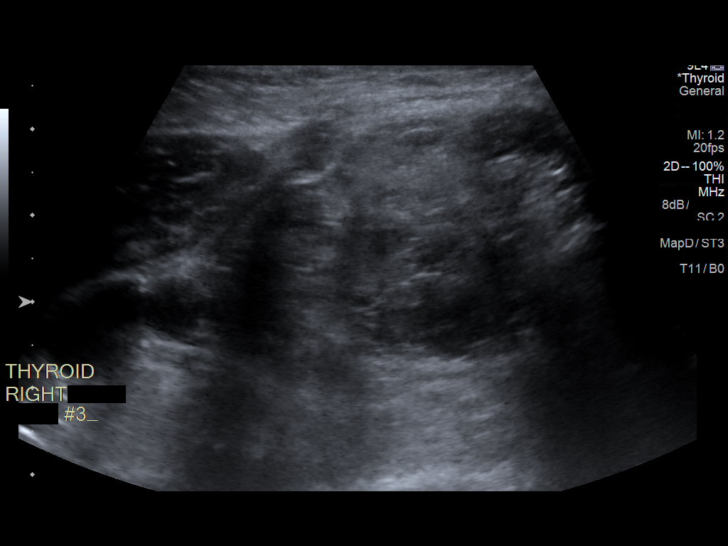
[im 10/14]
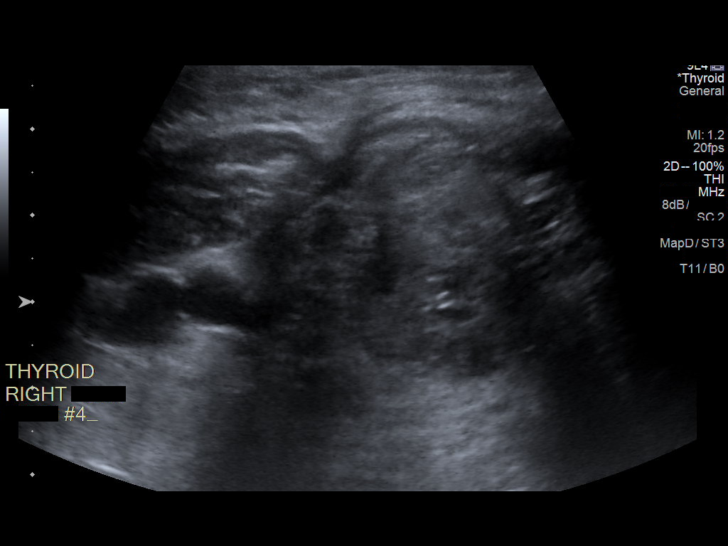
[im 11/14]
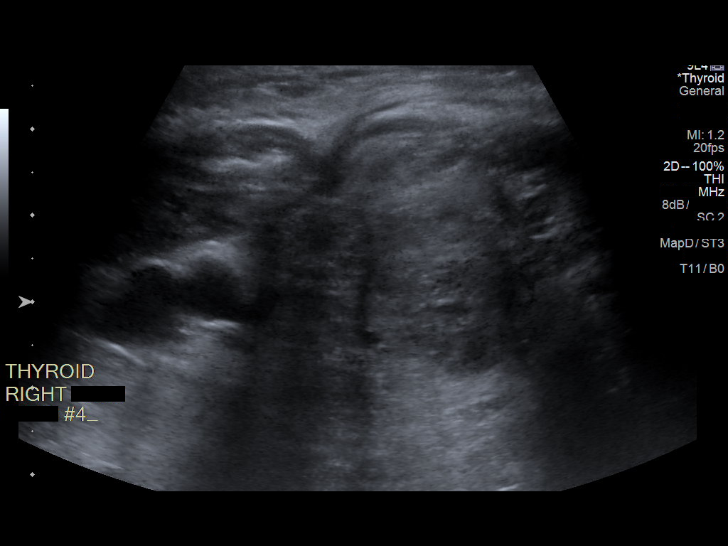
[im 12/14]
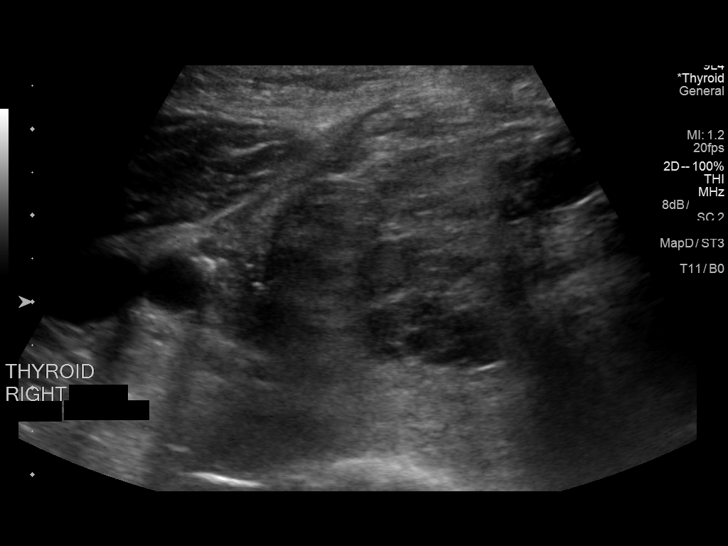
[im 13/14]
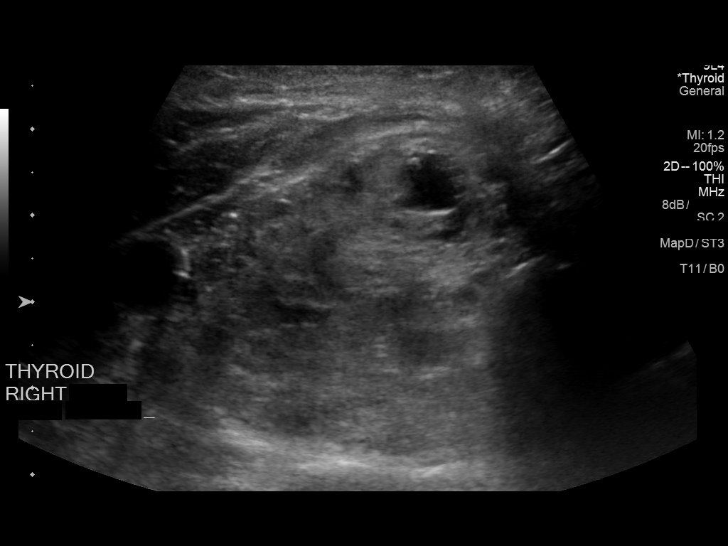
[im 14/14]
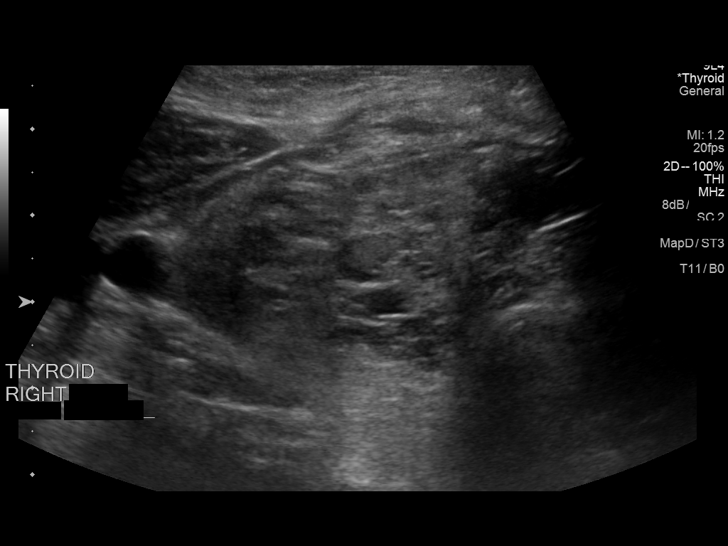

[13 of 14 positions shown; findings below may reference images not displayed]

Pre-procedural ultrasound scanning demonstrated unchanged size and
appearance of the indeterminate nodule within the right lobe of the
thyroid.

The procedure was planned. The neck was prepped in the usual sterile
fashion, and a sterile drape was applied covering the operative
field. A timeout was performed prior to the initiation of the
procedure. Local anesthesia was provided with 1% lidocaine.

Under direct ultrasound guidance, 4 FNA biopsies were performed of
the right mid thyroid nodule with a 25 gauge needle. Multiple
ultrasound images were saved for procedural documentation purposes.
The samples were prepared and submitted to pathology.

Limited post procedural scanning was negative for hematoma or
additional complication. Dressings were placed. The patient
tolerated the above procedures procedure well without immediate
postprocedural complication.
FINDINGS: FINDINGS
Nodule reference number based on prior diagnostic ultrasound: 1

Maximum size: 5.2 cm

Location: Right; Mid

ACR TI-RADS risk category: TR3 (3 points)

Reason for biopsy: meets ACR TI-RADS criteria

Ultrasound imaging confirms appropriate placement of the needles
within the thyroid nodule.
IMPRESSION: Technically successful ultrasound guided fine needle aspiration of
nodule 1 within the right mid thyroid.

## 2018-04-29 DIAGNOSIS — M7062 Trochanteric bursitis, left hip: Secondary | ICD-10-CM | POA: Diagnosis not present

## 2018-04-29 DIAGNOSIS — M5416 Radiculopathy, lumbar region: Secondary | ICD-10-CM | POA: Diagnosis not present

## 2018-04-29 DIAGNOSIS — M545 Low back pain: Secondary | ICD-10-CM | POA: Diagnosis not present

## 2018-05-16 DIAGNOSIS — M7062 Trochanteric bursitis, left hip: Secondary | ICD-10-CM | POA: Diagnosis not present

## 2018-08-14 DIAGNOSIS — H40033 Anatomical narrow angle, bilateral: Secondary | ICD-10-CM | POA: Diagnosis not present

## 2018-08-14 DIAGNOSIS — H04123 Dry eye syndrome of bilateral lacrimal glands: Secondary | ICD-10-CM | POA: Diagnosis not present

## 2018-08-20 DIAGNOSIS — M545 Low back pain: Secondary | ICD-10-CM | POA: Diagnosis not present

## 2018-08-20 DIAGNOSIS — M5416 Radiculopathy, lumbar region: Secondary | ICD-10-CM | POA: Diagnosis not present

## 2019-01-28 ENCOUNTER — Other Ambulatory Visit: Payer: Self-pay | Admitting: Family Medicine

## 2019-01-28 DIAGNOSIS — Z1231 Encounter for screening mammogram for malignant neoplasm of breast: Secondary | ICD-10-CM

## 2019-01-30 DIAGNOSIS — M5416 Radiculopathy, lumbar region: Secondary | ICD-10-CM | POA: Diagnosis not present

## 2019-01-30 DIAGNOSIS — M545 Low back pain: Secondary | ICD-10-CM | POA: Diagnosis not present

## 2019-02-03 DIAGNOSIS — I10 Essential (primary) hypertension: Secondary | ICD-10-CM | POA: Diagnosis not present

## 2019-02-03 DIAGNOSIS — E039 Hypothyroidism, unspecified: Secondary | ICD-10-CM | POA: Diagnosis not present

## 2019-02-03 DIAGNOSIS — M1990 Unspecified osteoarthritis, unspecified site: Secondary | ICD-10-CM | POA: Diagnosis not present

## 2019-02-03 DIAGNOSIS — E7800 Pure hypercholesterolemia, unspecified: Secondary | ICD-10-CM | POA: Diagnosis not present

## 2019-03-21 ENCOUNTER — Ambulatory Visit
Admission: RE | Admit: 2019-03-21 | Discharge: 2019-03-21 | Disposition: A | Discharge status: Home / Self Care | Payer: PPO | Source: Ambulatory Visit | Attending: Family Medicine | Admitting: Family Medicine

## 2019-03-21 ENCOUNTER — Other Ambulatory Visit: Payer: Self-pay

## 2019-03-21 DIAGNOSIS — Z1231 Encounter for screening mammogram for malignant neoplasm of breast: Secondary | ICD-10-CM | POA: Diagnosis not present

## 2019-04-14 DIAGNOSIS — I10 Essential (primary) hypertension: Secondary | ICD-10-CM | POA: Diagnosis not present

## 2019-04-14 DIAGNOSIS — E7800 Pure hypercholesterolemia, unspecified: Secondary | ICD-10-CM | POA: Diagnosis not present

## 2019-04-14 DIAGNOSIS — F411 Generalized anxiety disorder: Secondary | ICD-10-CM | POA: Diagnosis not present

## 2019-04-14 DIAGNOSIS — E039 Hypothyroidism, unspecified: Secondary | ICD-10-CM | POA: Diagnosis not present

## 2019-04-14 DIAGNOSIS — Z0000 Encounter for general adult medical examination without abnormal findings: Secondary | ICD-10-CM | POA: Diagnosis not present

## 2019-04-21 DIAGNOSIS — I10 Essential (primary) hypertension: Secondary | ICD-10-CM | POA: Diagnosis not present

## 2019-04-21 DIAGNOSIS — M1990 Unspecified osteoarthritis, unspecified site: Secondary | ICD-10-CM | POA: Diagnosis not present

## 2019-04-21 DIAGNOSIS — E039 Hypothyroidism, unspecified: Secondary | ICD-10-CM | POA: Diagnosis not present

## 2019-04-21 DIAGNOSIS — E7800 Pure hypercholesterolemia, unspecified: Secondary | ICD-10-CM | POA: Diagnosis not present

## 2019-06-02 DIAGNOSIS — K219 Gastro-esophageal reflux disease without esophagitis: Secondary | ICD-10-CM | POA: Diagnosis not present

## 2019-07-04 DIAGNOSIS — I10 Essential (primary) hypertension: Secondary | ICD-10-CM | POA: Diagnosis not present

## 2019-07-04 DIAGNOSIS — E7800 Pure hypercholesterolemia, unspecified: Secondary | ICD-10-CM | POA: Diagnosis not present

## 2019-07-04 DIAGNOSIS — E039 Hypothyroidism, unspecified: Secondary | ICD-10-CM | POA: Diagnosis not present

## 2019-07-04 DIAGNOSIS — M1990 Unspecified osteoarthritis, unspecified site: Secondary | ICD-10-CM | POA: Diagnosis not present

## 2019-07-31 DIAGNOSIS — M545 Low back pain: Secondary | ICD-10-CM | POA: Diagnosis not present

## 2019-07-31 DIAGNOSIS — M5416 Radiculopathy, lumbar region: Secondary | ICD-10-CM | POA: Diagnosis not present

## 2019-08-13 DIAGNOSIS — E039 Hypothyroidism, unspecified: Secondary | ICD-10-CM | POA: Diagnosis not present

## 2019-08-13 DIAGNOSIS — I10 Essential (primary) hypertension: Secondary | ICD-10-CM | POA: Diagnosis not present

## 2019-08-13 DIAGNOSIS — E7800 Pure hypercholesterolemia, unspecified: Secondary | ICD-10-CM | POA: Diagnosis not present

## 2019-08-13 DIAGNOSIS — M1990 Unspecified osteoarthritis, unspecified site: Secondary | ICD-10-CM | POA: Diagnosis not present

## 2019-09-11 DIAGNOSIS — I10 Essential (primary) hypertension: Secondary | ICD-10-CM | POA: Diagnosis not present

## 2019-09-11 DIAGNOSIS — E7800 Pure hypercholesterolemia, unspecified: Secondary | ICD-10-CM | POA: Diagnosis not present

## 2019-09-11 DIAGNOSIS — E039 Hypothyroidism, unspecified: Secondary | ICD-10-CM | POA: Diagnosis not present

## 2019-09-11 DIAGNOSIS — M1990 Unspecified osteoarthritis, unspecified site: Secondary | ICD-10-CM | POA: Diagnosis not present

## 2019-10-07 DIAGNOSIS — Z131 Encounter for screening for diabetes mellitus: Secondary | ICD-10-CM | POA: Diagnosis not present

## 2019-10-07 DIAGNOSIS — R002 Palpitations: Secondary | ICD-10-CM | POA: Diagnosis not present

## 2019-10-19 NOTE — Progress Notes (Signed)
Cardiology Office Note:   Date:  10/21/2019  NAME:  Kelly Watson    MRN: [id number] DOB:  25-Feb-1951   PCP:  Kathyrn Lass, MD  Cardiologist:  No primary care provider on file.   Referring MD: Aretta Nip, MD   Chief Complaint  Patient presents with  . Dizziness    History of Present Illness:   Kelly Watson is a 69 y.o. female with a hx of HTN, hypothyroidism who is being seen today for the evaluation of dizziness/palpitations at the request of Kathyrn Lass, MD.  She reports since January of this year roughly 8 months she has had intermittent episodes of dizziness and palpitations.  Apparently when she does exercise classes that result in her arms above her head she can get dizzy and lightheaded.  She reports tunnel vision but is not passed out.  She reports she takes a break and her symptoms resolved.  She reports when she does not do exercises over her head she does not get the symptoms.  She can use an exercise bike for up to 30 minutes without any chest pain, shortness of breath or palpitations.  She reports she can get some intermittent chest tightness with this occurs 3 times a week and occurs randomly.  She describes it as a sharp stabbing pain.  Again, she gets none of this when she exercises.  Her medical history is significant for hypertension that is controlled on metoprolol.  She is not diabetic.  Her most recent lipid profile shows a total cholesterol 185, HDL 59, LDL 102, triglycerides 137.  Her most recent A1c was 5.8.  Most recent TSH 1.97.  She has no strong family history of heart disease.  She is a former smoker.  She is retired.  She is a mother of 2 children and has 7 grandchildren.  Her EKG today demonstrates normal sinus rhythm with no acute ST changes or evidence of prior infarction.  She does have a noticeable right carotid bruit.  Past Medical History: Past Medical History:  Diagnosis Date  . Anxiety   . Hypercholesterolemia   . Hypertension   .  Hypothyroidism   . Personal history of pulmonary embolism 2011  . Right knee DJD   . Trigger ring finger of left hand 08/18/2014    Past Surgical History: Past Surgical History:  Procedure Laterality Date  . ABDOMINAL HYSTERECTOMY     for fibroid  . KNEE SURGERY  2011   left total knee  Pulmonary Embolism post op  . SHOULDER SURGERY     right rotator cuff  . THYROIDECTOMY     partial; due to benign thyroid nodule.   Marland Kitchen TOTAL KNEE ARTHROPLASTY  04/01/2012   RIGHT KNEE  Dr Dayna Ramus  . TOTAL KNEE ARTHROPLASTY  04/01/2012   Procedure: TOTAL KNEE ARTHROPLASTY;  Surgeon: Lorn Junes, MD;  Location: [facility];  Service: Orthopedics;  Laterality: Right;  . TRIGGER FINGER RELEASE Left 08/25/2014   Procedure: LEFT 4TH FINGER TRIGGER RELEASE;  Surgeon: Elsie Saas, MD;  Location: [facility];  Service: Orthopedics;  Laterality: Left;  . TUBAL LIGATION      Current Medications: Current Meds  Medication Sig  . acetaminophen (TYLENOL) 325 MG tablet Take 2 tablets (650 mg total) by mouth every 6 (six) hours as needed for pain (or Fever >/= 101).  Marland Kitchen levothyroxine (SYNTHROID, LEVOTHROID) 75 MCG tablet Take 75 mcg by mouth daily.  Marland Kitchen LORazepam (ATIVAN) 0.5 MG tablet Take 1 tablet by  mouth every 8 (eight) hours as needed. anxiety  . Melatonin 10 MG CAPS Take 1 tablet by mouth at bedtime.  . metoprolol succinate (TOPROL-XL) 50 MG 24 hr tablet Take 50 mg by mouth daily. Take with or immediately following a meal.   . Multiple Vitamin (MULTIVITAMIN) tablet Take 2 tablets by mouth daily.  . pantoprazole (PROTONIX) 20 MG tablet Take 20 mg by mouth daily.  . phenylephrine (SUDAFED PE) 10 MG TABS tablet Take 10 mg by mouth every 4 (four) hours as needed.  . pregabalin (LYRICA) 75 MG capsule Take 75 mg by mouth daily.  . simvastatin (ZOCOR) 40 MG tablet Take 40 mg by mouth every evening.     Allergies:    Aspirin, Milk-related compounds, and Tramadol   Social History: Social History    Socioeconomic History  . Marital status: Married    Spouse name: Not on file  . Number of children: 2  . Years of education: Not on file  . Highest education level: Not on file  Occupational History  . Occupation: retired    Fish farm manager: [employer]    Comment: [employer]   Tobacco Use  . Smoking status: Former Smoker    Packs/day: 0.50    Years: 15.00    Pack years: 7.50    Types: Cigarettes    Quit date: 02/27/1985    Years since quitting: 34.6  . Smokeless tobacco: Never Used  Substance and Sexual Activity  . Alcohol use: No  . Drug use: No  . Sexual activity: Not on file  Other Topics Concern  . Not on file  Social History Narrative  . Not on file   Social Determinants of Health   Financial Resource Strain:   . Difficulty of Paying Living Expenses: Not on file  Food Insecurity:   . Worried About Charity fundraiser in the Last Year: Not on file  . Ran Out of Food in the Last Year: Not on file  Transportation Needs:   . Lack of Transportation (Medical): Not on file  . Lack of Transportation (Non-Medical): Not on file  Physical Activity:   . Days of Exercise per Week: Not on file  . Minutes of Exercise per Session: Not on file  Stress:   . Feeling of Stress : Not on file  Social Connections:   . Frequency of Communication with Friends and Family: Not on file  . Frequency of Social Gatherings with Friends and Family: Not on file  . Attends Religious Services: Not on file  . Active Member of Clubs or Organizations: Not on file  . Attends Archivist Meetings: Not on file  . Marital Status: Not on file     Family History: The patient's family history includes Asthma in her daughter; Breast cancer in her maternal grandmother and mother; Cancer in her brother.  ROS:   All other ROS reviewed and negative. Pertinent positives noted in the HPI.     EKGs/Labs/Other Studies Reviewed:   The following studies were personally reviewed by me today:  EKG:  EKG is ordered  today.  The ekg ordered today demonstrates normal sinus rhythm, heart rate 69, no acute ST-T changes, no evidence of prior infarction, and was personally reviewed by me.   Recent Labs: No results found for requested labs within last 8760 hours.   Recent Lipid Panel    Component Value Date/Time   CHOL  05/19/2009 0603    134        ATP III  CLASSIFICATION:  <200     mg/dL   Desirable  200-239  mg/dL   Borderline High  >=240    mg/dL   High          TRIG 81 05/19/2009 0603   HDL 43 05/19/2009 0603   CHOLHDL 3.1 05/19/2009 0603   VLDL 16 05/19/2009 0603   LDLCALC  05/19/2009 0603    75        Total Cholesterol/HDL:CHD Risk Coronary Heart Disease Risk Table                     Men   Women  1/2 Average Risk   3.4   3.3  Average Risk       5.0   4.4  2 X Average Risk   9.6   7.1  3 X Average Risk  23.4   11.0        Use the calculated Patient Ratio above and the CHD Risk Table to determine the patient's CHD Risk.        ATP III CLASSIFICATION (LDL):  <100     mg/dL   Optimal  100-129  mg/dL   Near or Above                    Optimal  130-159  mg/dL   Borderline  160-189  mg/dL   High  >190     mg/dL   Very High    Physical Exam:   VS:  BP 140/70   Pulse 69   Ht 5\' 10"  (1.778 m)   Wt 248 lb (112.5 kg)   SpO2 96%   BMI 35.58 kg/m    Wt Readings from Last 3 Encounters:  10/21/19 248 lb (112.5 kg)  08/25/14 239 lb 8 oz (108.6 kg)  07/19/12 224 lb 1.6 oz (101.7 kg)    General: Well nourished, well developed, in no acute distress Heart: Atraumatic, normal size  Eyes: PEERLA, EOMI  Neck: Supple, no JVD, right carotid bruit noted Endocrine: No thryomegaly Cardiac: Normal S1, S2; RRR; no murmurs, rubs, or gallops Lungs: Clear to auscultation bilaterally, no wheezing, rhonchi or rales  Abd: Soft, nontender, no hepatomegaly  Ext: No edema, pulses 2+ Musculoskeletal: No deformities, BUE and BLE strength normal and equal Skin: Warm and dry, no rashes   Neuro: Alert and  oriented to person, place, time, and situation, CNII-XII grossly intact, no focal deficits  Psych: Normal mood and affect   ASSESSMENT:   RAFFAELA LADLEY is a 69 y.o. female who presents for the following: 1. Palpitations   2. Dizziness   3. Essential hypertension   4. Bruit of right carotid artery     PLAN:   1. Palpitations 2. Dizziness 3. Essential hypertension 4. Bruit of right carotid artery -She presents with intermittent dizziness and palpitations when she exercises with her arms above her head.  She does have a right carotid bruit.  Possibly there is a subclavian steal syndrome here?Marland Kitchen  We will proceed with vascular ultrasounds of her neck and get a look at the subclavian as well.  She also describes palpitations.  Her recent thyroid studies were normal.  I want her to proceed with an echocardiogram given the bruit as well as a 3-day Zio patch to exclude arrhythmia.  I do want her to exercise and do the same activity that can bring on her symptoms.  She does report atypical chest pain.  I am not alarmed by this  right now.  When she does not put her arms above her head she can complete a high level of activity without any symptoms.  We will start with the above work-up and I will see her back in 3 months.   Disposition: Return in about 3 months (around 01/21/2020).  Medication Adjustments/Labs and Tests Ordered: Current medicines are reviewed at length with the patient today.  Concerns regarding medicines are outlined above.  Orders Placed This Encounter  Procedures  . LONG TERM MONITOR (3-14 DAYS)  . EKG 12-Lead  . ECHOCARDIOGRAM COMPLETE  . VAS US CAROTID   No orders of the defined types were placed in this encounter.   Patient Instructions  Medication Instructions:  The current medical regimen is effective;  continue present plan and medications.  *If you need a refill on your cardiac medications before your next appointment, please call your  pharmacy*  Testing/Procedures: Echocardiogram - Your physician has requested that you have an echocardiogram. Echocardiography is a painless test that uses sound waves to create images of your heart. It provides your doctor with information about the size and shape of your heart and how well your heart's chambers and valves are working. This procedure takes approximately one hour. There are no restrictions for this procedure. This will be performed at our [facility] location - [address], [address].  Your physician has requested that you have a carotid duplex. This test is an ultrasound of the carotid arteries in your neck. It looks at blood flow through these arteries that supply the brain with blood. Allow one hour for this exam. There are no restrictions or special instructions.  Your physician has recommended that you wear a 3 DAY ZIO-PATCH monitor. The Zio patch cardiac monitor continuously records heart rhythm data for up to 14 days, this is for patients being evaluated for multiple types heart rhythms. For the first 24 hours post application, please avoid getting the Zio monitor wet in the shower or by excessive sweating during exercise. After that, feel free to carry on with regular activities. Keep soaps and lotions away from the ZIO XT Patch.  This will be mailed to you, please expect 7-10 days to receive.    Applying the monitor   Shave hair from upper left chest.   Hold abrader disc by orange tab.  Rub abrader in 40 strokes over left upper chest as indicated in your monitor instructions.   Clean area with 4 enclosed alcohol pads .  Use all pads to assure are is cleaned thoroughly.  Let dry.   Apply patch as indicated in monitor instructions.  Patch will be place under collarbone on left side of chest with arrow pointing upward.   Rub patch adhesive wings for 2 minutes.Remove white label marked "1".  Remove white label marked "2".  Rub patch adhesive wings for 2 additional  minutes.   While looking in a mirror, press and release button in center of patch.  A small green light will flash 3-4 times .  This will be your only indicator the monitor has been turned on.     Do not shower for the first 24 hours.  You may shower after the first 24 hours.   Press button if you feel a symptom. You will hear a small click.  Record Date, Time and Symptom in the Patient Log Book.   When you are ready to remove patch, follow instructions on last 2 pages of Patient Log Book.  Stick  patch monitor onto last page of Patient Log Book.   Place Patient Log Book in [place] box.  Use locking tab on box and tape box closed securely.  The [employer] and [employer] has [employer] on it.  Please place in mailbox as soon as possible.  Your physician should have your test results approximately 7 days after the monitor has been mailed back to [facility].   Call [place] at [phone] if you have questions regarding your ZIO XT patch monitor.  Call them immediately if you see an orange light blinking on your monitor.   If your monitor falls off in less than 4 days contact our Monitor department at [phone].  If your monitor becomes loose or falls off after 4 days call [employer] at [phone] for suggestions on securing your monitor     Follow-Up: At [facility], you and your health needs are our priority.  As part of our continuing mission to provide you with exceptional heart care, we have created designated Provider Care Teams.  These Care Teams include your primary Cardiologist (physician) and Advanced Practice Providers (APPs -  Physician Assistants and Nurse Practitioners) who all work together to provide you with the care you need, when you need it.  We recommend signing up for the patient portal called "MyChart".  Sign up information is provided on this After Visit Summary.  MyChart is used to connect with patients for Virtual Visits (Telemedicine).   Patients are able to view lab/test results, encounter notes, upcoming appointments, etc.  Non-urgent messages can be sent to your provider as well.   To learn more about what you can do with MyChart, go to [email or web address].    Your next appointment:   3 month(s)  The format for your next appointment:   In Person  Provider:   Eleonore Chiquito, MD         Signed, Addison Naegeli. Audie Box, [place]  [address], [place], [place] [address] [phone]  10/21/2019 4:54 PM

## 2019-10-21 ENCOUNTER — Other Ambulatory Visit: Payer: Self-pay

## 2019-10-21 ENCOUNTER — Encounter: Payer: Self-pay | Admitting: Cardiovascular Disease

## 2019-10-21 ENCOUNTER — Ambulatory Visit (INDEPENDENT_AMBULATORY_CARE_PROVIDER_SITE_OTHER): Payer: PPO | Admitting: Cardiovascular Disease

## 2019-10-21 VITALS — BP 140/70 | HR 69 | Ht 70.0 in | Wt 248.0 lb

## 2019-10-21 DIAGNOSIS — E7800 Pure hypercholesterolemia, unspecified: Secondary | ICD-10-CM | POA: Diagnosis not present

## 2019-10-21 DIAGNOSIS — R42 Dizziness and giddiness: Secondary | ICD-10-CM

## 2019-10-21 DIAGNOSIS — M1990 Unspecified osteoarthritis, unspecified site: Secondary | ICD-10-CM | POA: Diagnosis not present

## 2019-10-21 DIAGNOSIS — R0989 Other specified symptoms and signs involving the circulatory and respiratory systems: Secondary | ICD-10-CM | POA: Diagnosis not present

## 2019-10-21 DIAGNOSIS — E039 Hypothyroidism, unspecified: Secondary | ICD-10-CM | POA: Diagnosis not present

## 2019-10-21 DIAGNOSIS — H43813 Vitreous degeneration, bilateral: Secondary | ICD-10-CM | POA: Diagnosis not present

## 2019-10-21 DIAGNOSIS — H5203 Hypermetropia, bilateral: Secondary | ICD-10-CM | POA: Diagnosis not present

## 2019-10-21 DIAGNOSIS — R002 Palpitations: Secondary | ICD-10-CM | POA: Diagnosis not present

## 2019-10-21 DIAGNOSIS — H52223 Regular astigmatism, bilateral: Secondary | ICD-10-CM | POA: Diagnosis not present

## 2019-10-21 DIAGNOSIS — I10 Essential (primary) hypertension: Secondary | ICD-10-CM | POA: Diagnosis not present

## 2019-10-21 DIAGNOSIS — H25813 Combined forms of age-related cataract, bilateral: Secondary | ICD-10-CM | POA: Diagnosis not present

## 2019-10-21 NOTE — Patient Instructions (Signed)
Medication Instructions:  The current medical regimen is effective;  continue present plan and medications.  *If you need a refill on your cardiac medications before your next appointment, please call your pharmacy*  Testing/Procedures: Echocardiogram - Your physician has requested that you have an echocardiogram. Echocardiography is a painless test that uses sound waves to create images of your heart. It provides your doctor with information about the size and shape of your heart and how well your heart's chambers and valves are working. This procedure takes approximately one hour. There are no restrictions for this procedure. This will be performed at our [facility] location - [address].  Your physician has requested that you have a carotid duplex. This test is an ultrasound of the carotid arteries in your neck. It looks at blood flow through these arteries that supply the brain with blood. Allow one hour for this exam. There are no restrictions or special instructions.  Your physician has recommended that you wear a 3 DAY ZIO-PATCH monitor. The Zio patch cardiac monitor continuously records heart rhythm data for up to 14 days, this is for patients being evaluated for multiple types heart rhythms. For the first 24 hours post application, please avoid getting the Zio monitor wet in the shower or by excessive sweating during exercise. After that, feel free to carry on with regular activities. Keep soaps and lotions away from the ZIO XT Patch.  This will be mailed to you, please expect 7-10 days to receive.    Applying the monitor   Shave hair from upper left chest.   Hold abrader disc by orange tab.  Rub abrader in 40 strokes over left upper chest as indicated in your monitor instructions.   Clean area with 4 enclosed alcohol pads .  Use all pads to assure are is cleaned thoroughly.  Let dry.   Apply patch as indicated in monitor instructions.  Patch will be place under  collarbone on left side of chest with arrow pointing upward.   Rub patch adhesive wings for 2 minutes.Remove white label marked "1".  Remove white label marked "2".  Rub patch adhesive wings for 2 additional minutes.   While looking in a mirror, press and release button in center of patch.  A small green light will flash 3-4 times .  This will be your only indicator the monitor has been turned on.     Do not shower for the first 24 hours.  You may shower after the first 24 hours.   Press button if you feel a symptom. You will hear a small click.  Record Date, Time and Symptom in the Patient Log Book.   When you are ready to remove patch, follow instructions on last 2 pages of Patient Log Book.  Stick patch monitor onto last page of Patient Log Book.   Place Patient Log Book in [place] box.  Use locking tab on box and tape box closed securely.  The [employer] and [employer] has [employer] on it.  Please place in mailbox as soon as possible.  Your physician should have your test results approximately 7 days after the monitor has been mailed back to [facility].   Call [place] at [phone] if you have questions regarding your ZIO XT patch monitor.  Call them immediately if you see an orange light blinking on your monitor.   If your monitor falls off in less than 4 days contact our Monitor department at [phone].  If your monitor becomes loose or falls off after 4 days call [employer] at [phone] for suggestions on securing your monitor     Follow-Up: At [facility], you and your health needs are our priority.  As part of our continuing mission to provide you with exceptional heart care, we have created designated Provider Care Teams.  These Care Teams include your primary Cardiologist (physician) and Advanced Practice Providers (APPs -  Physician Assistants and Nurse Practitioners) who all work together to provide you with the care you need, when you need  it.  We recommend signing up for the patient portal called "MyChart".  Sign up information is provided on this After Visit Summary.  MyChart is used to connect with patients for Virtual Visits (Telemedicine).  Patients are able to view lab/test results, encounter notes, upcoming appointments, etc.  Non-urgent messages can be sent to your provider as well.   To learn more about what you can do with MyChart, go to [email or web address].    Your next appointment:   3 month(s)  The format for your next appointment:   In Person  Provider:   Eleonore Chiquito, MD

## 2019-10-22 ENCOUNTER — Encounter: Payer: Self-pay | Admitting: *Deleted

## 2019-10-22 NOTE — Progress Notes (Signed)
Patient ID: Kelly Watson, female   DOB: May 21, 1950, 69 y.o.   MRN: [id number] Patient enrolled for [employer] to ship a 3 day ZIO XT long term holter monitor to her home.

## 2019-10-27 ENCOUNTER — Ambulatory Visit (HOSPITAL_COMMUNITY)
Admission: RE | Admit: 2019-10-27 | Discharge: 2019-10-27 | Disposition: A | Discharge status: Home / Self Care | Payer: PPO | Source: Ambulatory Visit | Attending: Cardiology | Admitting: Cardiology

## 2019-10-27 ENCOUNTER — Other Ambulatory Visit (INDEPENDENT_AMBULATORY_CARE_PROVIDER_SITE_OTHER): Payer: PPO

## 2019-10-27 ENCOUNTER — Other Ambulatory Visit: Payer: Self-pay

## 2019-10-27 DIAGNOSIS — R0989 Other specified symptoms and signs involving the circulatory and respiratory systems: Secondary | ICD-10-CM

## 2019-10-27 DIAGNOSIS — R002 Palpitations: Secondary | ICD-10-CM | POA: Diagnosis not present

## 2019-10-30 MED ORDER — CLOPIDOGREL BISULFATE 75 MG PO TABS: 75.0000 mg | ORAL_TABLET | Freq: Every day | ORAL | 3 refills | Status: DC

## 2019-10-31 ENCOUNTER — Other Ambulatory Visit: Payer: Self-pay

## 2019-10-31 ENCOUNTER — Ambulatory Visit (HOSPITAL_COMMUNITY): Payer: PPO | Attending: Cardiology

## 2019-10-31 DIAGNOSIS — E785 Hyperlipidemia, unspecified: Secondary | ICD-10-CM | POA: Diagnosis not present

## 2019-10-31 DIAGNOSIS — R002 Palpitations: Secondary | ICD-10-CM | POA: Insufficient documentation

## 2019-10-31 DIAGNOSIS — I10 Essential (primary) hypertension: Secondary | ICD-10-CM | POA: Diagnosis not present

## 2019-10-31 DIAGNOSIS — I358 Other nonrheumatic aortic valve disorders: Secondary | ICD-10-CM | POA: Diagnosis not present

## 2019-10-31 DIAGNOSIS — Z87891 Personal history of nicotine dependence: Secondary | ICD-10-CM | POA: Insufficient documentation

## 2019-10-31 DIAGNOSIS — R42 Dizziness and giddiness: Secondary | ICD-10-CM | POA: Diagnosis not present

## 2019-10-31 LAB — ECHOCARDIOGRAM COMPLETE
Area-P 1/2: 3.46 cm2
S' Lateral: 2.9 cm

## 2019-11-07 DIAGNOSIS — E039 Hypothyroidism, unspecified: Secondary | ICD-10-CM | POA: Diagnosis not present

## 2019-11-07 DIAGNOSIS — R002 Palpitations: Secondary | ICD-10-CM | POA: Diagnosis not present

## 2019-11-07 DIAGNOSIS — M1990 Unspecified osteoarthritis, unspecified site: Secondary | ICD-10-CM | POA: Diagnosis not present

## 2019-11-07 DIAGNOSIS — E7800 Pure hypercholesterolemia, unspecified: Secondary | ICD-10-CM | POA: Diagnosis not present

## 2019-11-07 DIAGNOSIS — I10 Essential (primary) hypertension: Secondary | ICD-10-CM | POA: Diagnosis not present

## 2019-12-10 DIAGNOSIS — E039 Hypothyroidism, unspecified: Secondary | ICD-10-CM | POA: Diagnosis not present

## 2019-12-10 DIAGNOSIS — E7800 Pure hypercholesterolemia, unspecified: Secondary | ICD-10-CM | POA: Diagnosis not present

## 2019-12-10 DIAGNOSIS — M1990 Unspecified osteoarthritis, unspecified site: Secondary | ICD-10-CM | POA: Diagnosis not present

## 2019-12-10 DIAGNOSIS — I10 Essential (primary) hypertension: Secondary | ICD-10-CM | POA: Diagnosis not present

## 2020-01-15 NOTE — Progress Notes (Signed)
Cardiology Office Note:   Date:  01/16/2020  NAME:  Kelly Watson    MRN: [id number] DOB:  March 30, 1950   PCP:  Kathyrn Lass, MD  Cardiologist:  No primary care provider on file.   Referring MD: Kathyrn Lass, MD   Chief Complaint  Patient presents with   Follow-up   History of Present Illness:   Kelly Watson is a 69 y.o. female with a hx of HTN, HLD who presents for follow-up of dizziness and palpitations. Carotid US with minimal disease. Normal monitor and echo.   She reports her symptoms of dizziness and lightheadedness have resolved.  She is still doing high-level activities.  She is working out at [employer] 3 days/week.  This includes classes such as Zumba.  When she does not work out she will do yard work.  Apparently she does a lot of digging and heavy lifting.  She does get fatigue with his level of activity but has no chest pain or shortness of breath.  Her blood pressure is well controlled 132/64.  She does take Plavix due to an aspirin allergy.  We did go over the results of her carotid ultrasounds which demonstrated minimal carotid plaque.  I recommend she switch to Crestor to get her LDL cholesterol a bit lower.  She is in agreement with this.  Her most recent LDL cholesterol is 102.  She will be due for annual labs with her primary care physician in the next few months.  She will then see me yearly.  We did discuss that her plaque buildup in her carotid arteries does not explain her dizziness.  We will plan to repeat ultrasound in 2 to 3 years.  Problem List 1. Carotid Artery disease -1-39% bilaterally  2. HTN 3. HLD -T chol 185, HDL 59, LDL 102, TG 137  Past Medical History: Past Medical History:  Diagnosis Date   Anxiety    Carotid artery occlusion    Hypercholesterolemia    Hypertension    Hypothyroidism    Personal history of pulmonary embolism 2011   Right knee DJD    Trigger ring finger of left hand 08/18/2014    Past Surgical History: Past  Surgical History:  Procedure Laterality Date   ABDOMINAL HYSTERECTOMY     for fibroid   KNEE SURGERY  2011   left total knee  Pulmonary Embolism post op   SHOULDER SURGERY     right rotator cuff   THYROIDECTOMY     partial; due to benign thyroid nodule.    TOTAL KNEE ARTHROPLASTY  04/01/2012   RIGHT KNEE  Dr Dayna Ramus   TOTAL KNEE ARTHROPLASTY  04/01/2012   Procedure: TOTAL KNEE ARTHROPLASTY;  Surgeon: Lorn Junes, MD;  Location: [place];  Service: Orthopedics;  Laterality: Right;   TRIGGER FINGER RELEASE Left 08/25/2014   Procedure: LEFT 4TH FINGER TRIGGER RELEASE;  Surgeon: Elsie Saas, MD;  Location: [place];  Service: Orthopedics;  Laterality: Left;   TUBAL LIGATION      Current Medications: Current Meds  Medication Sig   acetaminophen (TYLENOL) 325 MG tablet Take 2 tablets (650 mg total) by mouth every 6 (six) hours as needed for pain (or Fever >/= 101).   clopidogrel (PLAVIX) 75 MG tablet Take 1 tablet (75 mg total) by mouth daily.   levothyroxine (SYNTHROID, LEVOTHROID) 75 MCG tablet Take 75 mcg by mouth daily.   LORazepam (ATIVAN) 0.5 MG tablet Take 1 tablet by mouth every 8 (eight) hours as needed.  anxiety   Melatonin 10 MG CAPS Take 1 tablet by mouth at bedtime.   metoprolol succinate (TOPROL-XL) 50 MG 24 hr tablet Take 50 mg by mouth daily. Take with or immediately following a meal.    Multiple Vitamin (MULTIVITAMIN) tablet Take 2 tablets by mouth daily.   pantoprazole (PROTONIX) 20 MG tablet Take 20 mg by mouth daily.   phenylephrine (SUDAFED PE) 10 MG TABS tablet Take 10 mg by mouth every 4 (four) hours as needed.   pregabalin (LYRICA) 75 MG capsule Take 75 mg by mouth daily.   RESTASIS 0.05 % ophthalmic emulsion SMARTSIG:1 Drop(s) In Eye(s) Every 12 Hours   [DISCONTINUED] simvastatin (ZOCOR) 40 MG tablet Take 40 mg by mouth every evening.     Allergies:    Aspirin, Milk-related compounds, and Tramadol   Social History: Social  History   Socioeconomic History   Marital status: Married    Spouse name: Not on file   Number of children: 2   Years of education: Not on file   Highest education level: Not on file  Occupational History   Occupation: retired    Fish farm manager: [employer]    Comment: [employer]   Tobacco Use   Smoking status: Former Smoker    Packs/day: 0.50    Years: 15.00    Pack years: 7.50    Types: Cigarettes    Quit date: 02/27/1985    Years since quitting: 34.9   Smokeless tobacco: Never Used  Substance and Sexual Activity   Alcohol use: No   Drug use: No   Sexual activity: Not on file  Other Topics Concern   Not on file  Social History Narrative   Not on file   Social Determinants of Health   Financial Resource Strain:    Difficulty of Paying Living Expenses: Not on file  Food Insecurity:    Worried About Charity fundraiser in the Last Year: Not on file   [place] in the Last Year: Not on file  Transportation Needs:    Lack of Transportation (Medical): Not on file   Lack of Transportation (Non-Medical): Not on file  Physical Activity:    Days of Exercise per Week: Not on file   Minutes of Exercise per Session: Not on file  Stress:    Feeling of Stress : Not on file  Social Connections:    Frequency of Communication with Friends and Family: Not on file   Frequency of Social Gatherings with Friends and Family: Not on file   Attends Religious Services: Not on file   Active Member of Clubs or Organizations: Not on file   Attends Archivist Meetings: Not on file   Marital Status: Not on file     Family History: The patient's family history includes Asthma in her daughter; Breast cancer in her maternal grandmother and mother; Cancer in her brother.  ROS:   All other ROS reviewed and negative. Pertinent positives noted in the HPI.     EKGs/Labs/Other Studies Reviewed:   The following studies were personally reviewed by me today:  TTE  10/31/2019 1. Left ventricular ejection fraction, by estimation, is 60 to 65%. The  left ventricle has normal function. The left ventricle has no regional  wall motion abnormalities. Left ventricular diastolic parameters were  normal.  2. Right ventricular systolic function is normal. The right ventricular  size is normal.  3. The mitral valve is normal in structure. Trivial mitral valve  regurgitation. No evidence of  mitral stenosis.  4. The aortic valve is tricuspid. Aortic valve regurgitation is not  visualized. Mild aortic valve sclerosis is present, with no evidence of  aortic valve stenosis.  5. The inferior vena cava is normal in size with greater than 50%  respiratory variability, suggesting right atrial pressure of 3 mmHg.  Zio 11/08/2019 'Impression: 1. No significant arrhythmias detected.  2. Rare ectopy.    Carotid US 10/28/2019 Summary:  Right Carotid: Velocities in the right ICA are consistent with a 1-39%  stenosis.         Non-hemodynamically significant plaque <50% noted in the  CCA.         The ECA appears <50% stenosed.   Left Carotid: Velocities in the left ICA are consistent with a 1-39%  stenosis.          Non-hemodynamically significant plaque <50% noted in the  CCA.        The ECA appears <50% stenosed.   Vertebrals: Bilateral vertebral arteries demonstrate antegrade flow.  Subclavians: Normal flow hemodynamics were seen in bilateral subclavian        arteries.   Recent Labs: No results found for requested labs within last 8760 hours.   Recent Lipid Panel    Component Value Date/Time   CHOL  05/19/2009 0603    134        ATP III CLASSIFICATION:  <200     mg/dL   Desirable  200-239  mg/dL   Borderline High  >=240    mg/dL   High          TRIG 81 05/19/2009 0603   HDL 43 05/19/2009 0603   CHOLHDL 3.1 05/19/2009 0603   VLDL 16 05/19/2009 0603   LDLCALC  05/19/2009 0603    75        Total  Cholesterol/HDL:CHD Risk Coronary Heart Disease Risk Table                     Men   Women  1/2 Average Risk   3.4   3.3  Average Risk       5.0   4.4  2 X Average Risk   9.6   7.1  3 X Average Risk  23.4   11.0        Use the calculated Patient Ratio above and the CHD Risk Table to determine the patient's CHD Risk.        ATP III CLASSIFICATION (LDL):  <100     mg/dL   Optimal  100-129  mg/dL   Near or Above                    Optimal  130-159  mg/dL   Borderline  160-189  mg/dL   High  >190     mg/dL   Very High    Physical Exam:   VS:  BP 132/64    Pulse 84    Ht 5\' 10"  (1.778 m)    Wt 249 lb 9.6 oz (113.2 kg)    SpO2 97%    BMI 35.81 kg/m    Wt Readings from Last 3 Encounters:  01/16/20 249 lb 9.6 oz (113.2 kg)  10/21/19 248 lb (112.5 kg)  08/25/14 239 lb 8 oz (108.6 kg)    General: Well nourished, well developed, in no acute distress Heart: Atraumatic, normal size  Eyes: PEERLA, EOMI  Neck: Supple, no JVD Endocrine: No thryomegaly Cardiac: Normal S1, S2; RRR; no murmurs, rubs,  or gallops Lungs: Clear to auscultation bilaterally, no wheezing, rhonchi or rales  Abd: Soft, nontender, no hepatomegaly  Ext: No edema, pulses 2+ Musculoskeletal: No deformities, BUE and BLE strength normal and equal Skin: Warm and dry, no rashes   Neuro: Alert and oriented to person, place, time, and situation, CNII-XII grossly intact, no focal deficits  Psych: Normal mood and affect   ASSESSMENT:   Kelly Watson is a 69 y.o. female who presents for the following: 1. Palpitations   2. Bruit of right carotid artery   3. Dizziness   4. Bilateral carotid artery stenosis   5. Mixed hyperlipidemia     PLAN:   1. Palpitations -No significant arrhythmia on her monitor.  Normal echo.  Symptoms have resolved.  No further evaluation.  2. Bruit of right carotid artery -She did have minimal carotid plaque see below.  3. Dizziness -Symptoms have resolved.  Not related to a vascular  or cardiac issue.  Could have been some sort of viral illness.  She will continue exercising.  4. Bilateral carotid artery stenosis -Minimal carotid artery plaque bilaterally.  No strokelike symptoms.  We will continue her Plavix.  She has an aspirin allergy.  Her most recent LDL cholesterol was 102.  I recommended switch to Crestor 20 mg a day.  She will then have annual labs with her primary care physician and we will review these yearly.  Her goal LDL cholesterol is less than 70.  5. Mixed hyperlipidemia -Switch to Crestor.  Goal LDL cholesterol less than 70.   Disposition: Return in about 1 year (around 01/15/2021).  Medication Adjustments/Labs and Tests Ordered: Current medicines are reviewed at length with the patient today.  Concerns regarding medicines are outlined above.  No orders of the defined types were placed in this encounter.  Meds ordered this encounter  Medications   rosuvastatin (CRESTOR) 20 MG tablet    Sig: Take 1 tablet (20 mg total) by mouth daily.    Dispense:  90 tablet    Refill:  3    Patient Instructions  Medication Instructions:  Your physician has recommended you make the following change in your medication:  1.  Discontinue Simvastatin 2.  Start Crestor one tablet by mouth ( 20 mg) daily, sent in today # 90 to requested pharmacy.   *If you need a refill on your cardiac medications before your next appointment, please call your pharmacy*   Lab Work: None If you have labs (blood work) drawn today and your tests are completely normal, you will receive your results only by:  [place] (if you have MyChart) OR  A paper copy in the mail If you have any lab test that is abnormal or we need to change your treatment, we will call you to review the results.   Testing/Procedures: None   Follow-Up: At [facility], you and your health needs are our priority.  As part of our continuing mission to provide you with exceptional heart care, we  have created designated Provider Care Teams.  These Care Teams include your primary Cardiologist (physician) and Advanced Practice Providers (APPs -  Physician Assistants and Nurse Practitioners) who all work together to provide you with the care you need, when you need it.  We recommend signing up for the patient portal called "MyChart".  Sign up information is provided on this After Visit Summary.  MyChart is used to connect with patients for Virtual Visits (Telemedicine).  Patients are able to view lab/test results, encounter  notes, upcoming appointments, etc.  Non-urgent messages can be sent to your provider as well.   To learn more about what you can do with MyChart, go to [email or web address].    Your next appointment:   1 year(s)  The format for your next appointment:   In Person  Provider:   Eleonore Chiquito, MD   Other Instructions Your physician wants you to follow-up in: 1 year with Dr. Audie Box.  You will receive a reminder letter in the mail two months in advance. If you don't receive a letter, please call our office to schedule the follow-up appointment.      Time Spent with Patient: I have spent a total of 25 minutes with patient reviewing hospital notes, telemetry, EKGs, labs and examining the patient as well as establishing an assessment and plan that was discussed with the patient.  > 50% of time was spent in direct patient care.  Signed, Addison Naegeli. Audie Box, [place]  [address] [place], [place] [address] [phone]  01/16/2020 9:03 AM

## 2020-01-16 ENCOUNTER — Encounter: Payer: Self-pay | Admitting: Cardiovascular Disease

## 2020-01-16 ENCOUNTER — Ambulatory Visit: Payer: PPO | Admitting: Cardiovascular Disease

## 2020-01-16 VITALS — BP 132/64 | HR 84 | Ht 70.0 in | Wt 249.6 lb

## 2020-01-16 DIAGNOSIS — I6523 Occlusion and stenosis of bilateral carotid arteries: Secondary | ICD-10-CM | POA: Diagnosis not present

## 2020-01-16 DIAGNOSIS — E782 Mixed hyperlipidemia: Secondary | ICD-10-CM

## 2020-01-16 DIAGNOSIS — R0989 Other specified symptoms and signs involving the circulatory and respiratory systems: Secondary | ICD-10-CM

## 2020-01-16 DIAGNOSIS — R002 Palpitations: Secondary | ICD-10-CM

## 2020-01-16 DIAGNOSIS — R42 Dizziness and giddiness: Secondary | ICD-10-CM | POA: Diagnosis not present

## 2020-01-16 MED ORDER — ROSUVASTATIN CALCIUM 20 MG PO TABS: 20.0000 mg | ORAL_TABLET | Freq: Every day | ORAL | 3 refills | Status: DC

## 2020-01-16 NOTE — Patient Instructions (Signed)
Medication Instructions:  Your physician has recommended you make the following change in your medication:  1.  Discontinue Simvastatin 2.  Start Crestor one tablet by mouth ( 20 mg) daily, sent in today # 90 to requested pharmacy.   *If you need a refill on your cardiac medications before your next appointment, please call your pharmacy*   Lab Work: None If you have labs (blood work) drawn today and your tests are completely normal, you will receive your results only by: [place] Kitchen MyChart Message (if you have MyChart) OR . A paper copy in the mail If you have any lab test that is abnormal or we need to change your treatment, we will call you to review the results.   Testing/Procedures: None   Follow-Up: At [facility], you and your health needs are our priority.  As part of our continuing mission to provide you with exceptional heart care, we have created designated Provider Care Teams.  These Care Teams include your primary Cardiologist (physician) and Advanced Practice Providers (APPs -  Physician Assistants and Nurse Practitioners) who all work together to provide you with the care you need, when you need it.  We recommend signing up for the patient portal called "MyChart".  Sign up information is provided on this After Visit Summary.  MyChart is used to connect with patients for Virtual Visits (Telemedicine).  Patients are able to view lab/test results, encounter notes, upcoming appointments, etc.  Non-urgent messages can be sent to your provider as well.   To learn more about what you can do with MyChart, go to [email or web address].    Your next appointment:   1 year(s)  The format for your next appointment:   In Person  Provider:   Eleonore Chiquito, MD   Other Instructions Your physician wants you to follow-up in: 1 year with Dr. Audie Box.  You will receive a reminder letter in the mail two months in advance. If you don't receive a letter, please call our office to schedule the  follow-up appointment.

## 2020-01-19 DIAGNOSIS — M1990 Unspecified osteoarthritis, unspecified site: Secondary | ICD-10-CM | POA: Diagnosis not present

## 2020-01-19 DIAGNOSIS — E7800 Pure hypercholesterolemia, unspecified: Secondary | ICD-10-CM | POA: Diagnosis not present

## 2020-01-19 DIAGNOSIS — E039 Hypothyroidism, unspecified: Secondary | ICD-10-CM | POA: Diagnosis not present

## 2020-01-19 DIAGNOSIS — I10 Essential (primary) hypertension: Secondary | ICD-10-CM | POA: Diagnosis not present

## 2020-01-19 DIAGNOSIS — K219 Gastro-esophageal reflux disease without esophagitis: Secondary | ICD-10-CM | POA: Diagnosis not present

## 2020-01-30 DIAGNOSIS — E039 Hypothyroidism, unspecified: Secondary | ICD-10-CM | POA: Diagnosis not present

## 2020-01-30 DIAGNOSIS — I10 Essential (primary) hypertension: Secondary | ICD-10-CM | POA: Diagnosis not present

## 2020-01-30 DIAGNOSIS — E7800 Pure hypercholesterolemia, unspecified: Secondary | ICD-10-CM | POA: Diagnosis not present

## 2020-01-30 DIAGNOSIS — K219 Gastro-esophageal reflux disease without esophagitis: Secondary | ICD-10-CM | POA: Diagnosis not present

## 2020-01-30 DIAGNOSIS — M1990 Unspecified osteoarthritis, unspecified site: Secondary | ICD-10-CM | POA: Diagnosis not present

## 2020-02-17 DIAGNOSIS — H25813 Combined forms of age-related cataract, bilateral: Secondary | ICD-10-CM | POA: Diagnosis not present

## 2020-02-17 DIAGNOSIS — H524 Presbyopia: Secondary | ICD-10-CM | POA: Diagnosis not present

## 2020-02-17 DIAGNOSIS — H04123 Dry eye syndrome of bilateral lacrimal glands: Secondary | ICD-10-CM | POA: Diagnosis not present

## 2020-02-17 DIAGNOSIS — H5203 Hypermetropia, bilateral: Secondary | ICD-10-CM | POA: Diagnosis not present

## 2020-02-17 DIAGNOSIS — H43813 Vitreous degeneration, bilateral: Secondary | ICD-10-CM | POA: Diagnosis not present

## 2020-02-17 DIAGNOSIS — H52223 Regular astigmatism, bilateral: Secondary | ICD-10-CM | POA: Diagnosis not present

## 2020-03-10 ENCOUNTER — Other Ambulatory Visit: Payer: Self-pay | Admitting: Family Medicine

## 2020-03-10 DIAGNOSIS — Z1231 Encounter for screening mammogram for malignant neoplasm of breast: Secondary | ICD-10-CM

## 2020-03-19 ENCOUNTER — Other Ambulatory Visit: Payer: Self-pay

## 2020-03-19 ENCOUNTER — Ambulatory Visit
Admission: RE | Admit: 2020-03-19 | Discharge: 2020-03-19 | Disposition: A | Discharge status: Home / Self Care | Payer: PPO | Source: Ambulatory Visit

## 2020-03-19 DIAGNOSIS — Z1231 Encounter for screening mammogram for malignant neoplasm of breast: Secondary | ICD-10-CM

## 2020-03-25 DIAGNOSIS — M1990 Unspecified osteoarthritis, unspecified site: Secondary | ICD-10-CM | POA: Diagnosis not present

## 2020-03-25 DIAGNOSIS — E039 Hypothyroidism, unspecified: Secondary | ICD-10-CM | POA: Diagnosis not present

## 2020-03-25 DIAGNOSIS — I10 Essential (primary) hypertension: Secondary | ICD-10-CM | POA: Diagnosis not present

## 2020-03-25 DIAGNOSIS — E7800 Pure hypercholesterolemia, unspecified: Secondary | ICD-10-CM | POA: Diagnosis not present

## 2020-03-25 DIAGNOSIS — K219 Gastro-esophageal reflux disease without esophagitis: Secondary | ICD-10-CM | POA: Diagnosis not present

## 2020-04-14 DIAGNOSIS — K219 Gastro-esophageal reflux disease without esophagitis: Secondary | ICD-10-CM | POA: Diagnosis not present

## 2020-04-14 DIAGNOSIS — E039 Hypothyroidism, unspecified: Secondary | ICD-10-CM | POA: Diagnosis not present

## 2020-04-14 DIAGNOSIS — E669 Obesity, unspecified: Secondary | ICD-10-CM | POA: Diagnosis not present

## 2020-04-14 DIAGNOSIS — R7303 Prediabetes: Secondary | ICD-10-CM | POA: Diagnosis not present

## 2020-04-14 DIAGNOSIS — Z0000 Encounter for general adult medical examination without abnormal findings: Secondary | ICD-10-CM | POA: Diagnosis not present

## 2020-04-14 DIAGNOSIS — I10 Essential (primary) hypertension: Secondary | ICD-10-CM | POA: Diagnosis not present

## 2020-04-14 DIAGNOSIS — F411 Generalized anxiety disorder: Secondary | ICD-10-CM | POA: Diagnosis not present

## 2020-04-14 DIAGNOSIS — S29011A Strain of muscle and tendon of front wall of thorax, initial encounter: Secondary | ICD-10-CM | POA: Diagnosis not present

## 2020-04-14 DIAGNOSIS — E7800 Pure hypercholesterolemia, unspecified: Secondary | ICD-10-CM | POA: Diagnosis not present

## 2020-04-21 DIAGNOSIS — I10 Essential (primary) hypertension: Secondary | ICD-10-CM | POA: Diagnosis not present

## 2020-04-21 DIAGNOSIS — Z0000 Encounter for general adult medical examination without abnormal findings: Secondary | ICD-10-CM | POA: Diagnosis not present

## 2020-04-21 DIAGNOSIS — K219 Gastro-esophageal reflux disease without esophagitis: Secondary | ICD-10-CM | POA: Diagnosis not present

## 2020-04-21 DIAGNOSIS — E039 Hypothyroidism, unspecified: Secondary | ICD-10-CM | POA: Diagnosis not present

## 2020-04-21 DIAGNOSIS — E7800 Pure hypercholesterolemia, unspecified: Secondary | ICD-10-CM | POA: Diagnosis not present

## 2020-04-21 DIAGNOSIS — R7303 Prediabetes: Secondary | ICD-10-CM | POA: Diagnosis not present

## 2020-04-21 DIAGNOSIS — E669 Obesity, unspecified: Secondary | ICD-10-CM | POA: Diagnosis not present

## 2020-04-21 DIAGNOSIS — F411 Generalized anxiety disorder: Secondary | ICD-10-CM | POA: Diagnosis not present

## 2020-04-21 DIAGNOSIS — S29011A Strain of muscle and tendon of front wall of thorax, initial encounter: Secondary | ICD-10-CM | POA: Diagnosis not present

## 2020-04-27 DIAGNOSIS — H2513 Age-related nuclear cataract, bilateral: Secondary | ICD-10-CM | POA: Diagnosis not present

## 2020-04-27 DIAGNOSIS — H16223 Keratoconjunctivitis sicca, not specified as Sjogren's, bilateral: Secondary | ICD-10-CM | POA: Diagnosis not present

## 2020-05-07 DIAGNOSIS — M25551 Pain in right hip: Secondary | ICD-10-CM | POA: Diagnosis not present

## 2020-05-07 DIAGNOSIS — M5416 Radiculopathy, lumbar region: Secondary | ICD-10-CM | POA: Diagnosis not present

## 2020-05-17 DIAGNOSIS — M79604 Pain in right leg: Secondary | ICD-10-CM | POA: Diagnosis not present

## 2020-05-17 DIAGNOSIS — S76111D Strain of right quadriceps muscle, fascia and tendon, subsequent encounter: Secondary | ICD-10-CM | POA: Diagnosis not present

## 2020-06-01 DIAGNOSIS — H16223 Keratoconjunctivitis sicca, not specified as Sjogren's, bilateral: Secondary | ICD-10-CM | POA: Diagnosis not present

## 2020-06-01 DIAGNOSIS — H2513 Age-related nuclear cataract, bilateral: Secondary | ICD-10-CM | POA: Diagnosis not present

## 2020-06-25 DIAGNOSIS — E7800 Pure hypercholesterolemia, unspecified: Secondary | ICD-10-CM | POA: Diagnosis not present

## 2020-06-25 DIAGNOSIS — E785 Hyperlipidemia, unspecified: Secondary | ICD-10-CM | POA: Diagnosis not present

## 2020-06-25 DIAGNOSIS — M1990 Unspecified osteoarthritis, unspecified site: Secondary | ICD-10-CM | POA: Diagnosis not present

## 2020-06-25 DIAGNOSIS — E039 Hypothyroidism, unspecified: Secondary | ICD-10-CM | POA: Diagnosis not present

## 2020-06-25 DIAGNOSIS — K219 Gastro-esophageal reflux disease without esophagitis: Secondary | ICD-10-CM | POA: Diagnosis not present

## 2020-06-25 DIAGNOSIS — I10 Essential (primary) hypertension: Secondary | ICD-10-CM | POA: Diagnosis not present

## 2020-06-29 DIAGNOSIS — I10 Essential (primary) hypertension: Secondary | ICD-10-CM | POA: Diagnosis not present

## 2020-06-29 DIAGNOSIS — M1990 Unspecified osteoarthritis, unspecified site: Secondary | ICD-10-CM | POA: Diagnosis not present

## 2020-06-29 DIAGNOSIS — K219 Gastro-esophageal reflux disease without esophagitis: Secondary | ICD-10-CM | POA: Diagnosis not present

## 2020-06-29 DIAGNOSIS — E7800 Pure hypercholesterolemia, unspecified: Secondary | ICD-10-CM | POA: Diagnosis not present

## 2020-06-29 DIAGNOSIS — E039 Hypothyroidism, unspecified: Secondary | ICD-10-CM | POA: Diagnosis not present

## 2020-06-29 DIAGNOSIS — E785 Hyperlipidemia, unspecified: Secondary | ICD-10-CM | POA: Diagnosis not present

## 2020-07-06 DIAGNOSIS — E039 Hypothyroidism, unspecified: Secondary | ICD-10-CM | POA: Diagnosis not present

## 2020-09-20 ENCOUNTER — Other Ambulatory Visit: Payer: Self-pay | Admitting: Cardiovascular Disease

## 2020-09-24 DIAGNOSIS — E7800 Pure hypercholesterolemia, unspecified: Secondary | ICD-10-CM | POA: Diagnosis not present

## 2020-09-24 DIAGNOSIS — E039 Hypothyroidism, unspecified: Secondary | ICD-10-CM | POA: Diagnosis not present

## 2020-09-24 DIAGNOSIS — E785 Hyperlipidemia, unspecified: Secondary | ICD-10-CM | POA: Diagnosis not present

## 2020-09-24 DIAGNOSIS — M1990 Unspecified osteoarthritis, unspecified site: Secondary | ICD-10-CM | POA: Diagnosis not present

## 2020-09-24 DIAGNOSIS — I10 Essential (primary) hypertension: Secondary | ICD-10-CM | POA: Diagnosis not present

## 2020-09-24 DIAGNOSIS — K219 Gastro-esophageal reflux disease without esophagitis: Secondary | ICD-10-CM | POA: Diagnosis not present

## 2020-11-16 DIAGNOSIS — E785 Hyperlipidemia, unspecified: Secondary | ICD-10-CM | POA: Diagnosis not present

## 2020-11-16 DIAGNOSIS — E039 Hypothyroidism, unspecified: Secondary | ICD-10-CM | POA: Diagnosis not present

## 2020-11-16 DIAGNOSIS — I10 Essential (primary) hypertension: Secondary | ICD-10-CM | POA: Diagnosis not present

## 2020-11-16 DIAGNOSIS — E7800 Pure hypercholesterolemia, unspecified: Secondary | ICD-10-CM | POA: Diagnosis not present

## 2020-11-16 DIAGNOSIS — K219 Gastro-esophageal reflux disease without esophagitis: Secondary | ICD-10-CM | POA: Diagnosis not present

## 2020-11-16 DIAGNOSIS — M1990 Unspecified osteoarthritis, unspecified site: Secondary | ICD-10-CM | POA: Diagnosis not present

## 2020-12-17 ENCOUNTER — Other Ambulatory Visit: Payer: Self-pay | Admitting: Cardiovascular Disease

## 2020-12-20 ENCOUNTER — Other Ambulatory Visit: Payer: Self-pay | Admitting: Cardiovascular Disease

## 2020-12-22 DIAGNOSIS — K219 Gastro-esophageal reflux disease without esophagitis: Secondary | ICD-10-CM | POA: Diagnosis not present

## 2020-12-22 DIAGNOSIS — I10 Essential (primary) hypertension: Secondary | ICD-10-CM | POA: Diagnosis not present

## 2020-12-22 DIAGNOSIS — M1990 Unspecified osteoarthritis, unspecified site: Secondary | ICD-10-CM | POA: Diagnosis not present

## 2020-12-22 DIAGNOSIS — E785 Hyperlipidemia, unspecified: Secondary | ICD-10-CM | POA: Diagnosis not present

## 2020-12-22 DIAGNOSIS — E7800 Pure hypercholesterolemia, unspecified: Secondary | ICD-10-CM | POA: Diagnosis not present

## 2020-12-22 DIAGNOSIS — E039 Hypothyroidism, unspecified: Secondary | ICD-10-CM | POA: Diagnosis not present

## 2020-12-27 ENCOUNTER — Other Ambulatory Visit: Payer: Self-pay | Admitting: Cardiovascular Disease

## 2021-01-12 DIAGNOSIS — R1013 Epigastric pain: Secondary | ICD-10-CM | POA: Diagnosis not present

## 2021-01-12 DIAGNOSIS — R7303 Prediabetes: Secondary | ICD-10-CM | POA: Diagnosis not present

## 2021-01-12 DIAGNOSIS — I10 Essential (primary) hypertension: Secondary | ICD-10-CM | POA: Diagnosis not present

## 2021-01-12 DIAGNOSIS — E039 Hypothyroidism, unspecified: Secondary | ICD-10-CM | POA: Diagnosis not present

## 2021-01-12 DIAGNOSIS — R079 Chest pain, unspecified: Secondary | ICD-10-CM | POA: Diagnosis not present

## 2021-01-12 DIAGNOSIS — R5383 Other fatigue: Secondary | ICD-10-CM | POA: Diagnosis not present

## 2021-03-02 ENCOUNTER — Other Ambulatory Visit: Payer: Self-pay | Admitting: Family Medicine

## 2021-03-02 DIAGNOSIS — Z1231 Encounter for screening mammogram for malignant neoplasm of breast: Secondary | ICD-10-CM

## 2021-03-22 ENCOUNTER — Ambulatory Visit
Admission: RE | Admit: 2021-03-22 | Discharge: 2021-03-22 | Disposition: A | Discharge status: Home / Self Care | Payer: PPO | Source: Ambulatory Visit

## 2021-03-22 DIAGNOSIS — I10 Essential (primary) hypertension: Secondary | ICD-10-CM | POA: Diagnosis not present

## 2021-03-22 DIAGNOSIS — M1990 Unspecified osteoarthritis, unspecified site: Secondary | ICD-10-CM | POA: Diagnosis not present

## 2021-03-22 DIAGNOSIS — E7800 Pure hypercholesterolemia, unspecified: Secondary | ICD-10-CM | POA: Diagnosis not present

## 2021-03-22 DIAGNOSIS — Z1231 Encounter for screening mammogram for malignant neoplasm of breast: Secondary | ICD-10-CM | POA: Diagnosis not present

## 2021-03-22 DIAGNOSIS — K219 Gastro-esophageal reflux disease without esophagitis: Secondary | ICD-10-CM | POA: Diagnosis not present

## 2021-03-22 DIAGNOSIS — E785 Hyperlipidemia, unspecified: Secondary | ICD-10-CM | POA: Diagnosis not present

## 2021-03-22 DIAGNOSIS — E039 Hypothyroidism, unspecified: Secondary | ICD-10-CM | POA: Diagnosis not present

## 2021-04-20 DIAGNOSIS — I10 Essential (primary) hypertension: Secondary | ICD-10-CM | POA: Diagnosis not present

## 2021-04-20 DIAGNOSIS — M5136 Other intervertebral disc degeneration, lumbar region: Secondary | ICD-10-CM | POA: Diagnosis not present

## 2021-04-20 DIAGNOSIS — Z23 Encounter for immunization: Secondary | ICD-10-CM | POA: Diagnosis not present

## 2021-04-20 DIAGNOSIS — E7800 Pure hypercholesterolemia, unspecified: Secondary | ICD-10-CM | POA: Diagnosis not present

## 2021-04-20 DIAGNOSIS — F411 Generalized anxiety disorder: Secondary | ICD-10-CM | POA: Diagnosis not present

## 2021-04-20 DIAGNOSIS — E039 Hypothyroidism, unspecified: Secondary | ICD-10-CM | POA: Diagnosis not present

## 2021-04-20 DIAGNOSIS — Z86718 Personal history of other venous thrombosis and embolism: Secondary | ICD-10-CM | POA: Diagnosis not present

## 2021-04-20 DIAGNOSIS — E669 Obesity, unspecified: Secondary | ICD-10-CM | POA: Diagnosis not present

## 2021-04-20 DIAGNOSIS — Z0000 Encounter for general adult medical examination without abnormal findings: Secondary | ICD-10-CM | POA: Diagnosis not present

## 2021-04-20 DIAGNOSIS — R7303 Prediabetes: Secondary | ICD-10-CM | POA: Diagnosis not present

## 2021-04-20 DIAGNOSIS — K219 Gastro-esophageal reflux disease without esophagitis: Secondary | ICD-10-CM | POA: Diagnosis not present

## 2021-06-30 DIAGNOSIS — M48061 Spinal stenosis, lumbar region without neurogenic claudication: Secondary | ICD-10-CM | POA: Diagnosis not present

## 2021-07-04 DIAGNOSIS — M545 Low back pain, unspecified: Secondary | ICD-10-CM | POA: Diagnosis not present

## 2021-07-14 NOTE — Progress Notes (Signed)
Cardiology Office Note:   Date:  07/15/2021  NAME:  Kelly Watson    MRN: [id number] DOB:  03-Jan-1951   PCP:  Kathyrn Lass, MD  Cardiologist:  None  Electrophysiologist:  None   Referring MD: Kathyrn Lass, MD   Chief Complaint  Patient presents with   Follow-up        History of Present Illness:   Kelly Watson is a 71 y.o. female with a hx of hypertension hyperlipidemia, carotid artery disease who presents for follow-up.  Reports no chest pain or trouble breathing.  Most recent LDL cholesterol 83.  Acceptable given minimal carotid disease.  No strokelike symptoms.  No chest pain or trouble breathing.  Blood pressure is well controlled.  Overall doing well without complaints today.  Problem List 1. Carotid Artery disease -1-39% bilaterally  -On Plavix, aspirin allergy 2. HTN 3. HLD -T chol 1 174, HDL 65, LDL 83, triglycerides 154  Past Medical History: Past Medical History:  Diagnosis Date   Anxiety    Carotid artery occlusion    Hypercholesterolemia    Hypertension    Hypothyroidism    Personal history of pulmonary embolism 2011   Right knee DJD    Trigger ring finger of left hand 08/18/2014    Past Surgical History: Past Surgical History:  Procedure Laterality Date   ABDOMINAL HYSTERECTOMY     for fibroid   KNEE SURGERY  2011   left total knee  Pulmonary Embolism post op   SHOULDER SURGERY     right rotator cuff   THYROIDECTOMY     partial; due to benign thyroid nodule.    TOTAL KNEE ARTHROPLASTY  04/01/2012   RIGHT KNEE  Dr Dayna Ramus   TOTAL KNEE ARTHROPLASTY  04/01/2012   Procedure: TOTAL KNEE ARTHROPLASTY;  Surgeon: Lorn Junes, MD;  Location: [facility];  Service: Orthopedics;  Laterality: Right;   TRIGGER FINGER RELEASE Left 08/25/2014   Procedure: LEFT 4TH FINGER TRIGGER RELEASE;  Surgeon: Elsie Saas, MD;  Location: [facility];  Service: Orthopedics;  Laterality: Left;   TUBAL LIGATION      Current Medications: Current Meds   Medication Sig   acetaminophen (TYLENOL) 325 MG tablet Take 2 tablets (650 mg total) by mouth every 6 (six) hours as needed for pain (or Fever >/= 101).   clopidogrel (PLAVIX) 75 MG tablet TAKE ONE TABLET BY MOUTH ONCE DAILY   fluticasone (FLONASE) 50 MCG/ACT nasal spray INSTILL 2 SPRAYS INTO EACH NOSTRIL ONCE A DAY AS NEEDED   Ginkgo Biloba 40 MG TABS 2 tablets   levothyroxine (SYNTHROID, LEVOTHROID) 75 MCG tablet Take 75 mcg by mouth daily.   LORazepam (ATIVAN) 0.5 MG tablet Take 1 tablet by mouth every 8 (eight) hours as needed. anxiety   metoprolol succinate (TOPROL-XL) 50 MG 24 hr tablet Take 50 mg by mouth daily. Take with or immediately following a meal.    Multiple Vitamin (MULTIVITAMIN) tablet Take 2 tablets by mouth daily.   Omega-3 Fatty Acids (FISH OIL) 600 MG CAPS 1 tablet   pantoprazole (PROTONIX) 20 MG tablet Take 20 mg by mouth daily.   phenylephrine (SUDAFED PE) 10 MG TABS tablet Take 10 mg by mouth every 4 (four) hours as needed.   pregabalin (LYRICA) 75 MG capsule Take 75 mg by mouth daily.   RESTASIS 0.05 % ophthalmic emulsion SMARTSIG:1 Drop(s) In Eye(s) Every 12 Hours   rosuvastatin (CRESTOR) 20 MG tablet TAKE ONE TABLET BY MOUTH ONCE DAILY   Thiamine HCl (  VITAMIN B1) 100 MG TABS 1 tablet   vitamin B-12 (CYANOCOBALAMIN) 100 MCG tablet See admin instructions.   vitamin E 45 MG (100 UNITS) capsule See admin instructions.     Allergies:    Aspirin, Milk-related compounds, and Tramadol   Social History: Social History   Socioeconomic History   Marital status: Married    Spouse name: Not on file   Number of children: 2   Years of education: Not on file   Highest education level: Not on file  Occupational History   Occupation: retired    Fish farm manager: [employer]    Comment: [employer]   Tobacco Use   Smoking status: Former    Packs/day: 0.50    Years: 15.00    Pack years: 7.50    Types: Cigarettes    Quit date: 02/27/1985    Years since quitting: 36.4   Smokeless tobacco:  Never  Substance and Sexual Activity   Alcohol use: No   Drug use: No   Sexual activity: Not on file  Other Topics Concern   Not on file  Social History Narrative   Not on file   Social Determinants of Health   Financial Resource Strain: Not on file  Food Insecurity: Not on file  Transportation Needs: Not on file  Physical Activity: Not on file  Stress: Not on file  Social Connections: Not on file     Family History: The patient's family history includes Asthma in her daughter; Breast cancer in her maternal grandmother and mother; Cancer in her brother.  ROS:   All other ROS reviewed and negative. Pertinent positives noted in the HPI.     EKGs/Labs/Other Studies Reviewed:   The following studies were personally reviewed by me today:  Carotid US 10/27/2019 Summary:  Right Carotid: Velocities in the right ICA are consistent with a 1-39%  stenosis.                 Non-hemodynamically significant plaque <50% noted in the  CCA.                 The ECA appears <50% stenosed.   Left Carotid: Velocities in the left ICA are consistent with a 1-39%  stenosis.                   Non-hemodynamically significant plaque <50% noted in the  CCA.                The ECA appears <50% stenosed.   Recent Labs: No results found for requested labs within last 8760 hours.   Recent Lipid Panel    Component Value Date/Time   CHOL  05/19/2009 0603    134        ATP III CLASSIFICATION:  <200     mg/dL   Desirable  200-239  mg/dL   Borderline High  >=240    mg/dL   High          TRIG 81 05/19/2009 0603   HDL 43 05/19/2009 0603   CHOLHDL 3.1 05/19/2009 0603   VLDL 16 05/19/2009 0603   LDLCALC  05/19/2009 0603    75        Total Cholesterol/HDL:CHD Risk Coronary Heart Disease Risk Table                     Men   Women  1/2 Average Risk   3.4   3.3  Average Risk       5.0  4.4  2 X Average Risk   9.6   7.1  3 X Average Risk  23.4   11.0        Use the calculated Patient  Ratio above and the CHD Risk Table to determine the patient's CHD Risk.        ATP III CLASSIFICATION (LDL):  <100     mg/dL   Optimal  100-129  mg/dL   Near or Above                    Optimal  130-159  mg/dL   Borderline  160-189  mg/dL   High  >190     mg/dL   Very High    Physical Exam:   VS:  BP 138/76   Pulse 65   Ht '5\' 9"'$  (1.753 m)   Wt 230 lb (104.3 kg)   SpO2 95%   BMI 33.97 kg/m    Wt Readings from Last 3 Encounters:  07/15/21 230 lb (104.3 kg)  01/16/20 249 lb 9.6 oz (113.2 kg)  10/21/19 248 lb (112.5 kg)    General: Well nourished, well developed, in no acute distress Head: Atraumatic, normal size  Eyes: PEERLA, EOMI  Neck: Supple, no JVD Endocrine: No thryomegaly Cardiac: Normal S1, S2; RRR; no murmurs, rubs, or gallops Lungs: Clear to auscultation bilaterally, no wheezing, rhonchi or rales  Abd: Soft, nontender, no hepatomegaly  Ext: No edema, pulses 2+ Musculoskeletal: No deformities, BUE and BLE strength normal and equal Skin: Warm and dry, no rashes   Neuro: Alert and oriented to person, place, time, and situation, CNII-XII grossly intact, no focal deficits  Psych: Normal mood and affect   ASSESSMENT:   SHIVA KARIS is a 71 y.o. female who presents for the following: 1. Bilateral carotid artery stenosis   2. Mixed hyperlipidemia     PLAN:   1. Bilateral carotid artery stenosis 2. Mixed hyperlipidemia -Minimal carotid disease in 2021.  No significant bruit.  We will repeat carotids every 2 to 3 years.  Denies any strokelike symptoms.  On Plavix due to aspirin allergy.  On Crestor 20 mg daily.  Most recent LDL 83.  This is likely acceptable.  She will see [place] back in 1 year.      Disposition: Return in about 1 year (around 07/16/2022).  Medication Adjustments/Labs and Tests Ordered: Current medicines are reviewed at length with the patient today.  Concerns regarding medicines are outlined above.  Orders Placed This Encounter  Procedures    VAS US CAROTID   No orders of the defined types were placed in this encounter.   Patient Instructions  Medication Instructions:  The current medical regimen is effective;  continue present plan and medications.  *If you need a refill on your cardiac medications before your next appointment, please call your pharmacy*   Testing/Procedures: Your physician has requested that you have a carotid duplex. This test is an ultrasound of the carotid arteries in your neck. It looks at blood flow through these arteries that supply the brain with blood. Allow one hour for this exam. There are no restrictions or special instructions.    Follow-Up: At [facility], you and your health needs are our priority.  As part of our continuing mission to provide you with exceptional heart care, we have created designated Provider Care Teams.  These Care Teams include your primary Cardiologist (physician) and Advanced Practice Providers (APPs -  Physician Assistants and Nurse Practitioners) who all work  together to provide you with the care you need, when you need it.  We recommend signing up for the patient portal called "MyChart".  Sign up information is provided on this After Visit Summary.  MyChart is used to connect with patients for Virtual Visits (Telemedicine).  Patients are able to view lab/test results, encounter notes, upcoming appointments, etc.  Non-urgent messages can be sent to your provider as well.   To learn more about what you can do with MyChart, go to [email or web address].    Your next appointment:   12 month(s)  The format for your next appointment:   In Person  Provider:   Eleonore Chiquito, MD or Sande Rives, PA-C, or Almyra Deforest, PA-C           Time Spent with Patient: I have spent a total of 35 minutes with patient reviewing hospital notes, telemetry, EKGs, labs and examining the patient as well as establishing an assessment and plan that was discussed with the patient.   > 50% of time was spent in direct patient care.  Signed, Kelly Watson. Audie Box, MD, [place]  [address] [place], [place] [address] [phone]  07/15/2021 9:37 AM

## 2021-07-15 ENCOUNTER — Ambulatory Visit (INDEPENDENT_AMBULATORY_CARE_PROVIDER_SITE_OTHER): Payer: PPO | Admitting: Cardiovascular Disease

## 2021-07-15 ENCOUNTER — Encounter: Payer: Self-pay | Admitting: Cardiovascular Disease

## 2021-07-15 VITALS — BP 138/76 | HR 65 | Ht 69.0 in | Wt 230.0 lb

## 2021-07-15 DIAGNOSIS — M47816 Spondylosis without myelopathy or radiculopathy, lumbar region: Secondary | ICD-10-CM | POA: Diagnosis not present

## 2021-07-15 DIAGNOSIS — I6523 Occlusion and stenosis of bilateral carotid arteries: Secondary | ICD-10-CM | POA: Diagnosis not present

## 2021-07-15 DIAGNOSIS — E782 Mixed hyperlipidemia: Secondary | ICD-10-CM

## 2021-07-15 NOTE — Patient Instructions (Signed)
Medication Instructions:  The current medical regimen is effective;  continue present plan and medications.  *If you need a refill on your cardiac medications before your next appointment, please call your pharmacy*   Testing/Procedures: Your physician has requested that you have a carotid duplex. This test is an ultrasound of the carotid arteries in your neck. It looks at blood flow through these arteries that supply the brain with blood. Allow one hour for this exam. There are no restrictions or special instructions.    Follow-Up: At [facility], you and your health needs are our priority.  As part of our continuing mission to provide you with exceptional heart care, we have created designated Provider Care Teams.  These Care Teams include your primary Cardiologist (physician) and Advanced Practice Providers (APPs -  Physician Assistants and Nurse Practitioners) who all work together to provide you with the care you need, when you need it.  We recommend signing up for the patient portal called "MyChart".  Sign up information is provided on this After Visit Summary.  MyChart is used to connect with patients for Virtual Visits (Telemedicine).  Patients are able to view lab/test results, encounter notes, upcoming appointments, etc.  Non-urgent messages can be sent to your provider as well.   To learn more about what you can do with MyChart, go to [email or web address].    Your next appointment:   12 month(s)  The format for your next appointment:   In Person  Provider:   Eleonore Chiquito, MD or Sande Rives, PA-C, or Almyra Deforest, [place]

## 2021-07-18 ENCOUNTER — Ambulatory Visit (HOSPITAL_COMMUNITY)
Admission: RE | Admit: 2021-07-18 | Discharge: 2021-07-18 | Disposition: A | Discharge status: Home / Self Care | Payer: PPO | Source: Ambulatory Visit | Attending: Internal Medicine | Admitting: Internal Medicine

## 2021-07-18 DIAGNOSIS — I6523 Occlusion and stenosis of bilateral carotid arteries: Secondary | ICD-10-CM | POA: Diagnosis not present

## 2021-09-10 ENCOUNTER — Other Ambulatory Visit: Payer: Self-pay | Admitting: Cardiovascular Disease

## 2021-10-21 ENCOUNTER — Other Ambulatory Visit: Payer: Self-pay | Admitting: Family Medicine

## 2021-10-21 ENCOUNTER — Ambulatory Visit
Admission: RE | Admit: 2021-10-21 | Discharge: 2021-10-21 | Disposition: A | Discharge status: Home / Self Care | Payer: PPO | Source: Ambulatory Visit | Attending: Family Medicine | Admitting: Family Medicine

## 2021-10-21 DIAGNOSIS — I10 Essential (primary) hypertension: Secondary | ICD-10-CM | POA: Diagnosis not present

## 2021-10-21 DIAGNOSIS — Z86718 Personal history of other venous thrombosis and embolism: Secondary | ICD-10-CM | POA: Diagnosis not present

## 2021-10-21 DIAGNOSIS — R0789 Other chest pain: Secondary | ICD-10-CM

## 2021-10-21 DIAGNOSIS — Z86711 Personal history of pulmonary embolism: Secondary | ICD-10-CM | POA: Diagnosis not present

## 2021-10-21 DIAGNOSIS — R7303 Prediabetes: Secondary | ICD-10-CM | POA: Diagnosis not present

## 2021-10-21 DIAGNOSIS — M1990 Unspecified osteoarthritis, unspecified site: Secondary | ICD-10-CM | POA: Diagnosis not present

## 2021-10-21 DIAGNOSIS — F411 Generalized anxiety disorder: Secondary | ICD-10-CM | POA: Diagnosis not present

## 2021-10-21 DIAGNOSIS — E039 Hypothyroidism, unspecified: Secondary | ICD-10-CM | POA: Diagnosis not present

## 2021-10-21 DIAGNOSIS — K219 Gastro-esophageal reflux disease without esophagitis: Secondary | ICD-10-CM | POA: Diagnosis not present

## 2021-11-15 DIAGNOSIS — R7303 Prediabetes: Secondary | ICD-10-CM | POA: Diagnosis not present

## 2021-11-15 DIAGNOSIS — E669 Obesity, unspecified: Secondary | ICD-10-CM | POA: Diagnosis not present

## 2021-11-15 DIAGNOSIS — K219 Gastro-esophageal reflux disease without esophagitis: Secondary | ICD-10-CM | POA: Diagnosis not present

## 2021-11-15 DIAGNOSIS — E785 Hyperlipidemia, unspecified: Secondary | ICD-10-CM | POA: Diagnosis not present

## 2021-11-15 DIAGNOSIS — Z6834 Body mass index (BMI) 34.0-34.9, adult: Secondary | ICD-10-CM | POA: Diagnosis not present

## 2021-11-15 DIAGNOSIS — N1832 Chronic kidney disease, stage 3b: Secondary | ICD-10-CM | POA: Diagnosis not present

## 2021-11-15 DIAGNOSIS — E039 Hypothyroidism, unspecified: Secondary | ICD-10-CM | POA: Diagnosis not present

## 2021-11-15 DIAGNOSIS — E7800 Pure hypercholesterolemia, unspecified: Secondary | ICD-10-CM | POA: Diagnosis not present

## 2021-12-06 DIAGNOSIS — N179 Acute kidney failure, unspecified: Secondary | ICD-10-CM | POA: Diagnosis not present

## 2021-12-06 DIAGNOSIS — I739 Peripheral vascular disease, unspecified: Secondary | ICD-10-CM | POA: Diagnosis not present

## 2021-12-06 DIAGNOSIS — I2699 Other pulmonary embolism without acute cor pulmonale: Secondary | ICD-10-CM | POA: Diagnosis not present

## 2021-12-06 DIAGNOSIS — N1832 Chronic kidney disease, stage 3b: Secondary | ICD-10-CM | POA: Diagnosis not present

## 2021-12-06 DIAGNOSIS — I129 Hypertensive chronic kidney disease with stage 1 through stage 4 chronic kidney disease, or unspecified chronic kidney disease: Secondary | ICD-10-CM | POA: Diagnosis not present

## 2021-12-06 DIAGNOSIS — E669 Obesity, unspecified: Secondary | ICD-10-CM | POA: Diagnosis not present

## 2021-12-06 DIAGNOSIS — E039 Hypothyroidism, unspecified: Secondary | ICD-10-CM | POA: Diagnosis not present

## 2021-12-15 ENCOUNTER — Telehealth: Payer: Self-pay | Admitting: Cardiovascular Disease

## 2021-12-15 DIAGNOSIS — R5383 Other fatigue: Secondary | ICD-10-CM | POA: Diagnosis not present

## 2021-12-15 DIAGNOSIS — R519 Headache, unspecified: Secondary | ICD-10-CM | POA: Diagnosis not present

## 2021-12-15 DIAGNOSIS — R002 Palpitations: Secondary | ICD-10-CM | POA: Diagnosis not present

## 2021-12-15 DIAGNOSIS — R0602 Shortness of breath: Secondary | ICD-10-CM | POA: Diagnosis not present

## 2021-12-15 DIAGNOSIS — R202 Paresthesia of skin: Secondary | ICD-10-CM | POA: Diagnosis not present

## 2021-12-15 DIAGNOSIS — R42 Dizziness and giddiness: Secondary | ICD-10-CM | POA: Diagnosis not present

## 2021-12-15 DIAGNOSIS — E042 Nontoxic multinodular goiter: Secondary | ICD-10-CM | POA: Diagnosis not present

## 2021-12-15 NOTE — Telephone Encounter (Signed)
Spoke with patient of Dr. Audie Box who reports palpitations x6 weeks. She said she may be under more stress. She saw PCP who did labs -- suggested CKD > nephrologist said she did not have CKD. She said the time between PCP and Dr. Joylene Grapes was "quite stressful" as she was worried, changed diet. She has had some dizziness and SOB can occur randomly. She checks BP some mornings -- 130/73. HR runs in the 90s. She has a visit scheduled on 10/25 with Raquel Sarna NP. Will send to Dr. Audie Box for review and advised will call her back if there are suggestions prior to visit

## 2021-12-15 NOTE — Telephone Encounter (Signed)
Patient c/o Palpitations:  High priority if patient c/o lightheadedness, shortness of breath, or chest pain  How long have you had palpitations/irregular HR/ Afib? Are you having the symptoms now? at least 6 weeks, no  Are you currently experiencing lightheadedness, SOB or CP? Has had dizziness and SOB but not now  Do you have a history of afib (atrial fibrillation) or irregular heart rhythm? no  Have you checked your BP or HR? (document readings if available): BP was checked this morning 154/82  Are you experiencing any other symptoms? No    Patient states she has had palpitations and some dizziness. She states she was seen at her PCP and they requested she schedule an appointment. She is scheduled for 12/21/2021.

## 2021-12-16 NOTE — Telephone Encounter (Signed)
Needs evaluation. Will await Emily's assessment.    [place] T. Audie Box, MD, [place]   [address], [place]  [place], [place] [address]  [phone]   5:21 PM

## 2021-12-21 ENCOUNTER — Ambulatory Visit: Payer: PPO | Attending: Nurse Practitioner | Admitting: Nurse Practitioner

## 2021-12-21 ENCOUNTER — Encounter: Payer: Self-pay | Admitting: Nurse Practitioner

## 2021-12-21 VITALS — BP 130/64 | HR 68 | Ht 69.0 in | Wt 233.0 lb

## 2021-12-21 DIAGNOSIS — E782 Mixed hyperlipidemia: Secondary | ICD-10-CM | POA: Diagnosis not present

## 2021-12-21 DIAGNOSIS — R002 Palpitations: Secondary | ICD-10-CM | POA: Diagnosis not present

## 2021-12-21 DIAGNOSIS — I10 Essential (primary) hypertension: Secondary | ICD-10-CM | POA: Diagnosis not present

## 2021-12-21 DIAGNOSIS — I6522 Occlusion and stenosis of left carotid artery: Secondary | ICD-10-CM

## 2021-12-21 DIAGNOSIS — R42 Dizziness and giddiness: Secondary | ICD-10-CM

## 2021-12-21 NOTE — Patient Instructions (Signed)
Medication Instructions:  Your physician recommends that you continue on your current medications as directed. Please refer to the Current Medication list given to you today.   *If you need a refill on your cardiac medications before your next appointment, please call your pharmacy*   Lab Work: NONE ordered at this time of appointment   If you have labs (blood work) drawn today and your tests are completely normal, you will receive your results only by: MyChart Message (if you have MyChart) OR A paper copy in the mail If you have any lab test that is abnormal or we need to change your treatment, we will call you to review the results.   Testing/Procedures: NONE ordered at this time of appointment     Follow-Up: At [facility], you and your health needs are our priority.  As part of our continuing mission to provide you with exceptional heart care, we have created designated Provider Care Teams.  These Care Teams include your primary Cardiologist (physician) and Advanced Practice Providers (APPs -  Physician Assistants and Nurse Practitioners) who all work together to provide you with the care you need, when you need it.  We recommend signing up for the patient portal called "MyChart".  Sign up information is provided on this After Visit Summary.  MyChart is used to connect with patients for Virtual Visits (Telemedicine).  Patients are able to view lab/test results, encounter notes, upcoming appointments, etc.  Non-urgent messages can be sent to your provider as well.   To learn more about what you can do with MyChart, go to [email or web address].    Your next appointment:   4-6 month(s)  The format for your next appointment:   In Person  Provider:   Fortine T O'Neal, MD     Other Instructions   Important Information About Sugar       

## 2021-12-21 NOTE — Progress Notes (Signed)
Office Visit    Patient Name: Kelly Watson Date of Encounter: 12/21/2021  Primary Care Provider:  Kathyrn Lass, MD Primary Cardiologist:  Evalina Field, MD  Chief Complaint    71 year old female with a history of palpitations, hypertension, hyperlipidemia, carotid artery disease, hypothyroidism, and anxiety who presents for follow-up related to palpitations.  Past Medical History    Past Medical History:  Diagnosis Date   Anxiety    Carotid artery occlusion    Hypercholesterolemia    Hypertension    Hypothyroidism    Personal history of pulmonary embolism 2011   Right knee DJD    Trigger ring finger of left hand 08/18/2014   Past Surgical History:  Procedure Laterality Date   ABDOMINAL HYSTERECTOMY     for fibroid   KNEE SURGERY  2011   left total knee  Pulmonary Embolism post op   SHOULDER SURGERY     right rotator cuff   THYROIDECTOMY     partial; due to benign thyroid nodule.    TOTAL KNEE ARTHROPLASTY  04/01/2012   RIGHT KNEE  Dr Dayna Ramus   TOTAL KNEE ARTHROPLASTY  04/01/2012   Procedure: TOTAL KNEE ARTHROPLASTY;  Surgeon: Lorn Junes, MD;  Location: [facility];  Service: Orthopedics;  Laterality: Right;   TRIGGER FINGER RELEASE Left 08/25/2014   Procedure: LEFT 4TH FINGER TRIGGER RELEASE;  Surgeon: Elsie Saas, MD;  Location: [facility];  Service: Orthopedics;  Laterality: Left;   TUBAL LIGATION      Allergies  Allergies  Allergen Reactions   Aspirin Hives   Milk-Related Compounds    Tramadol     Over sedates    History of Present Illness    71 year old female with the above past medical history including palpitations, hypertension, hyperlipidemia, carotid artery disease, hypothyroidism, and anxiety.  Echocardiogram in 10/2019 showed EF 60 to 65%, normal LV function, no RWMA, normal RV systolic function, mild aortic valve sclerosis with no evidence of aortic valve stenosis.  3-day cardiac monitor in 10/2019 setting of dizziness and  palpitations showed no significant arrhythmia, rare PACs and PVCs.  She does have a history of carotid artery disease.  Most recent carotid Dopplers in 06/2021 showed stable 1 to 95% LICA stenosis.  She was last seen in the office on 07/15/2021 and was stable from a cardiac standpoint.  She denied any chest pain, shortness of breath, or palpitations.  She contacted our office on 12/15/2021 and reported a 6-week history of palpitations and associated dizziness.  She presents today for follow-up.  Since her last visit he has been stable from a cardiac standpoint.  She has noticed an increased frequency and palpitations and episodes of dizziness and lightheadedness over the past several weeks. Upon further discussion it appears that during this time she was experiencing significant anxiety-she was told she had CKD and was referred to a nephrologist.  This caused much anxiety for her.  She later was told that she did not have CKD.  Since this time she has not noticed any recurrent symptoms.  Other than her recent dizziness and palpitations, she denies any additional concerns today.   Home Medications    Current Outpatient Medications  Medication Sig Dispense Refill   acetaminophen (TYLENOL) 325 MG tablet Take 2 tablets (650 mg total) by mouth every 6 (six) hours as needed for pain (or Fever >/= 101).     clopidogrel (PLAVIX) 75 MG tablet TAKE ONE TABLET BY MOUTH ONCE DAILY 90 tablet 3  fluticasone (FLONASE) 50 MCG/ACT nasal spray INSTILL 2 SPRAYS INTO EACH NOSTRIL ONCE A DAY AS NEEDED     Ginkgo Biloba 40 MG TABS 2 tablets     levothyroxine (SYNTHROID) 50 MCG tablet Take 50 mcg by mouth every morning.     LORazepam (ATIVAN) 0.5 MG tablet Take 1 tablet by mouth every 8 (eight) hours as needed. anxiety     metoprolol succinate (TOPROL-XL) 50 MG 24 hr tablet Take 50 mg by mouth daily. Take with or immediately following a meal.      Multiple Vitamin (MULTIVITAMIN) tablet Take 2 tablets by mouth daily.      Omega-3 Fatty Acids (FISH OIL) 600 MG CAPS 1 tablet     pantoprazole (PROTONIX) 20 MG tablet Take 20 mg by mouth daily.     phenylephrine (SUDAFED PE) 10 MG TABS tablet Take 10 mg by mouth every 4 (four) hours as needed.     pregabalin (LYRICA) 75 MG capsule Take 75 mg by mouth daily.     rosuvastatin (CRESTOR) 20 MG tablet TAKE ONE TABLET BY MOUTH ONCE DAILY 90 tablet 2   Thiamine HCl (VITAMIN B1) 100 MG TABS 1 tablet     vitamin B-12 (CYANOCOBALAMIN) 100 MCG tablet See admin instructions.     vitamin E 45 MG (100 UNITS) capsule See admin instructions.     levothyroxine (SYNTHROID, LEVOTHROID) 75 MCG tablet Take 75 mcg by mouth daily. (Patient not taking: Reported on 12/21/2021)     No current facility-administered medications for this visit.     Review of Systems    She denies chest pain, dyspnea, pnd, orthopnea, n, v, syncope, edema, weight gain, or early satiety. All other systems reviewed and are otherwise negative except as noted above.   Physical Exam    VS:  BP 130/64   Pulse 68   Ht '5\' 9"'$  (1.753 m)   Wt 233 lb (105.7 kg)   BMI 34.41 kg/m  GEN: Well nourished, well developed, in no acute distress. HEENT: normal. Neck: Supple, no JVD, carotid bruits, or masses. Cardiac: RRR, no murmurs, rubs, or gallops. No clubbing, cyanosis, edema.  Radials/DP/PT 2+ and equal bilaterally.  Respiratory:  Respirations regular and unlabored, clear to auscultation bilaterally. GI: Soft, nontender, nondistended, BS + x 4. MS: no deformity or atrophy. Skin: warm and dry, no rash. Neuro:  Strength and sensation are intact. Psych: Normal affect.  Accessory Clinical Findings    ECG personally reviewed by me today -NSR, 68 bpm- no acute changes.   Lab Results  Component Value Date   WBC 6.0 07/19/2012   HGB 14.6 08/25/2014   HCT 43.0 08/25/2014   MCV 93.4 07/19/2012   PLT 199 07/19/2012   Lab Results  Component Value Date   CREATININE 0.90 08/25/2014   BUN 14 08/25/2014   NA 141  08/25/2014   K 4.1 08/25/2014   CL 104 08/25/2014   CO2 25 04/02/2012   Lab Results  Component Value Date   ALT 29 03/26/2012   AST 33 03/26/2012   ALKPHOS 64 03/26/2012   BILITOT 0.2 (L) 03/26/2012   Lab Results  Component Value Date   CHOL  05/19/2009    134        ATP III CLASSIFICATION:  <200     mg/dL   Desirable  200-239  mg/dL   Borderline High  >=240    mg/dL   High          HDL 43 05/19/2009   LDLCALC  05/19/2009    75        Total Cholesterol/HDL:CHD Risk Coronary Heart Disease Risk Table                     Men   Women  1/2 Average Risk   3.4   3.3  Average Risk       5.0   4.4  2 X Average Risk   9.6   7.1  3 X Average Risk  23.4   11.0        Use the calculated Patient Ratio above and the CHD Risk Table to determine the patient's CHD Risk.        ATP III CLASSIFICATION (LDL):  <100     mg/dL   Optimal  100-129  mg/dL   Near or Above                    Optimal  130-159  mg/dL   Borderline  160-189  mg/dL   High  >190     mg/dL   Very High   TRIG 81 05/19/2009   CHOLHDL 3.1 05/19/2009    No results found for: "HGBA1C"  Assessment & Plan   1. Palpitations/dizziness:  3-day cardiac monitor in 10/2019 setting of dizziness and palpitations showed no significant arrhythmia, rare PACs and PVCs.  Echocardiogram at the time showed EF 60 to 65%, normal LV function, no RWMA, normal RV systolic function, mild aortic valve sclerosis with no evidence of aortic valve stenosis.  She notes a several week history of intermittent palpitations which she describes as a fleeting fluttering in her chest, no associated symptoms.  She also notes occasional, dizziness/lightheadedness.  Upon further questioning, it appears that her symptoms may be related to anxiety.  She was experiencing a stressful situation during the time of her symptoms.  Since this resolved, she has not had any further symptoms.  Prior work-up reassuring.  I do not think additional testing is warranted at this  time.  Continue to monitor symptoms.  Discussed ED precautions.  I advised her to notify us if her symptoms become more persistent/frequent.  I also advised her to discuss possible treatment options for anxiety management with her PCP.  Patient agreeable to plan.  Continue metoprolol.  2. Hypertension: BP well controlled. Continue current antihypertensive regimen.   3. Hyperlipidemia: LDL was 83 in 03/2021.  Continue Plavix, Crestor.  4. Carotid artery disease: Most recent carotid Dopplers in 06/2021 showed stable 1 to 77% LICA stenosis.  Continue Plavix, Crestor.  5. Disposition: Follow-up in 4 to 6 months.      Lenna Sciara, NP 12/21/2021, 10:02 AM

## 2021-12-28 DIAGNOSIS — E669 Obesity, unspecified: Secondary | ICD-10-CM | POA: Diagnosis not present

## 2021-12-28 DIAGNOSIS — E039 Hypothyroidism, unspecified: Secondary | ICD-10-CM | POA: Diagnosis not present

## 2021-12-28 DIAGNOSIS — R7303 Prediabetes: Secondary | ICD-10-CM | POA: Diagnosis not present

## 2021-12-28 DIAGNOSIS — I10 Essential (primary) hypertension: Secondary | ICD-10-CM | POA: Diagnosis not present

## 2021-12-28 DIAGNOSIS — E785 Hyperlipidemia, unspecified: Secondary | ICD-10-CM | POA: Diagnosis not present

## 2021-12-28 DIAGNOSIS — Z6834 Body mass index (BMI) 34.0-34.9, adult: Secondary | ICD-10-CM | POA: Diagnosis not present

## 2022-02-06 ENCOUNTER — Other Ambulatory Visit: Payer: Self-pay | Admitting: Family Medicine

## 2022-02-06 DIAGNOSIS — Z1231 Encounter for screening mammogram for malignant neoplasm of breast: Secondary | ICD-10-CM

## 2022-02-13 DIAGNOSIS — M25561 Pain in right knee: Secondary | ICD-10-CM | POA: Diagnosis not present

## 2022-02-13 DIAGNOSIS — M25562 Pain in left knee: Secondary | ICD-10-CM | POA: Diagnosis not present

## 2022-02-14 ENCOUNTER — Other Ambulatory Visit (HOSPITAL_COMMUNITY): Payer: Self-pay | Admitting: Orthopedic Surgery

## 2022-02-14 DIAGNOSIS — T84038A Mechanical loosening of other internal prosthetic joint, initial encounter: Secondary | ICD-10-CM

## 2022-02-28 ENCOUNTER — Encounter (HOSPITAL_COMMUNITY)
Admission: RE | Admit: 2022-02-28 | Discharge: 2022-02-28 | Disposition: A | Discharge status: Home / Self Care | Payer: PPO | Source: Ambulatory Visit | Attending: Orthopedic Surgery | Admitting: Orthopedic Surgery

## 2022-02-28 DIAGNOSIS — M7989 Other specified soft tissue disorders: Secondary | ICD-10-CM | POA: Diagnosis not present

## 2022-02-28 DIAGNOSIS — Z96659 Presence of unspecified artificial knee joint: Secondary | ICD-10-CM

## 2022-02-28 DIAGNOSIS — T84038A Mechanical loosening of other internal prosthetic joint, initial encounter: Secondary | ICD-10-CM | POA: Insufficient documentation

## 2022-02-28 DIAGNOSIS — M25562 Pain in left knee: Secondary | ICD-10-CM | POA: Diagnosis not present

## 2022-02-28 DIAGNOSIS — Z8739 Personal history of other diseases of the musculoskeletal system and connective tissue: Secondary | ICD-10-CM | POA: Diagnosis not present

## 2022-02-28 DIAGNOSIS — Z96653 Presence of artificial knee joint, bilateral: Secondary | ICD-10-CM | POA: Diagnosis not present

## 2022-02-28 MED ORDER — TECHNETIUM TC 99M MEDRONATE IV KIT
20.0000 | PACK | Freq: Once | INTRAVENOUS | Status: AC | PRN
  Administered 2022-02-28: 20 via INTRAVENOUS

## 2022-03-06 DIAGNOSIS — M5416 Radiculopathy, lumbar region: Secondary | ICD-10-CM | POA: Diagnosis not present

## 2022-03-06 DIAGNOSIS — M47816 Spondylosis without myelopathy or radiculopathy, lumbar region: Secondary | ICD-10-CM | POA: Diagnosis not present

## 2022-03-29 DIAGNOSIS — M5416 Radiculopathy, lumbar region: Secondary | ICD-10-CM | POA: Diagnosis not present

## 2022-04-03 ENCOUNTER — Ambulatory Visit
Admission: RE | Admit: 2022-04-03 | Discharge: 2022-04-03 | Disposition: A | Discharge status: Home / Self Care | Payer: PPO | Source: Ambulatory Visit | Attending: Family Medicine | Admitting: Family Medicine

## 2022-04-03 DIAGNOSIS — Z1231 Encounter for screening mammogram for malignant neoplasm of breast: Secondary | ICD-10-CM

## 2022-04-18 DIAGNOSIS — M5416 Radiculopathy, lumbar region: Secondary | ICD-10-CM | POA: Diagnosis not present

## 2022-04-18 DIAGNOSIS — M25512 Pain in left shoulder: Secondary | ICD-10-CM | POA: Diagnosis not present

## 2022-04-18 DIAGNOSIS — M542 Cervicalgia: Secondary | ICD-10-CM | POA: Diagnosis not present

## 2022-04-26 DIAGNOSIS — M7542 Impingement syndrome of left shoulder: Secondary | ICD-10-CM | POA: Diagnosis not present

## 2022-04-26 DIAGNOSIS — M25612 Stiffness of left shoulder, not elsewhere classified: Secondary | ICD-10-CM | POA: Diagnosis not present

## 2022-04-26 DIAGNOSIS — M6281 Muscle weakness (generalized): Secondary | ICD-10-CM | POA: Diagnosis not present

## 2022-05-03 DIAGNOSIS — M25612 Stiffness of left shoulder, not elsewhere classified: Secondary | ICD-10-CM | POA: Diagnosis not present

## 2022-05-03 DIAGNOSIS — M6281 Muscle weakness (generalized): Secondary | ICD-10-CM | POA: Diagnosis not present

## 2022-05-03 DIAGNOSIS — M7542 Impingement syndrome of left shoulder: Secondary | ICD-10-CM | POA: Diagnosis not present

## 2022-05-04 NOTE — Progress Notes (Signed)
Cardiology Office Note:   Date:  05/05/2022  NAME:  Kelly Watson    MRN: [id number] DOB:  1950-09-11   PCP:  Faustino Congress, NP  Cardiologist:  Evalina Field, MD  Electrophysiologist:  None   Referring MD: Kathyrn Lass, MD   Chief Complaint  Patient presents with   Follow-up        History of Present Illness:   Kelly Watson is a 72 y.o. female with a hx of carotid artery plaque, palpitations who presents for follow-up.  She reports she is doing well.  Reports she does have sharp chest discomfort in her chest when laying down at night.  Not exertional.  She reports she can press on her chest and feel this.  Not triggered by stress.  Seems to be constant.  Not improved with acid reflux medication.  Does not occur with exertion.  We discussed coronary CTA versus watchful waiting.  She is okay for watchful waiting.  Blood pressure 148/72.  She reports she was at a church event last night.  She was up late.  Blood pressure seems to be within limits otherwise.  She has minimal carotid artery disease.  She can stop Plavix.  Cholesterol level at goal.  CV examination normal.  Denies any major symptoms in office.  Problem List 1. Carotid Artery disease -1-39% bilaterally  2. HTN 3. HLD -T chol 1 174, HDL 65, LDL 83, triglycerides 154  Past Medical History: Past Medical History:  Diagnosis Date   Anxiety    Carotid artery occlusion    Hypercholesterolemia    Hypertension    Hypothyroidism    Personal history of pulmonary embolism 2011   Right knee DJD    Trigger ring finger of left hand 08/18/2014    Past Surgical History: Past Surgical History:  Procedure Laterality Date   ABDOMINAL HYSTERECTOMY     for fibroid   KNEE SURGERY  2011   left total knee  Pulmonary Embolism post op   SHOULDER SURGERY     right rotator cuff   THYROIDECTOMY     partial; due to benign thyroid nodule.    TOTAL KNEE ARTHROPLASTY  04/01/2012   RIGHT KNEE  Dr Dayna Ramus   TOTAL KNEE  ARTHROPLASTY  04/01/2012   Procedure: TOTAL KNEE ARTHROPLASTY;  Surgeon: Lorn Junes, MD;  Location: [facility];  Service: Orthopedics;  Laterality: Right;   TRIGGER FINGER RELEASE Left 08/25/2014   Procedure: LEFT 4TH FINGER TRIGGER RELEASE;  Surgeon: Elsie Saas, MD;  Location: [facility];  Service: Orthopedics;  Laterality: Left;   TUBAL LIGATION      Current Medications: Current Meds  Medication Sig   acetaminophen (TYLENOL) 325 MG tablet Take 2 tablets (650 mg total) by mouth every 6 (six) hours as needed for pain (or Fever >/= 101).   fluticasone (FLONASE) 50 MCG/ACT nasal spray INSTILL 2 SPRAYS INTO EACH NOSTRIL ONCE A DAY AS NEEDED   Ginkgo Biloba 40 MG TABS 2 tablets   levothyroxine (SYNTHROID) 50 MCG tablet Take 50 mcg by mouth every morning.   LORazepam (ATIVAN) 0.5 MG tablet Take 1 tablet by mouth every 8 (eight) hours as needed. anxiety   metoprolol succinate (TOPROL-XL) 50 MG 24 hr tablet Take 50 mg by mouth daily. Take with or immediately following a meal.    Multiple Vitamin (MULTIVITAMIN) tablet Take 2 tablets by mouth daily.   Omega-3 Fatty Acids (FISH OIL) 600 MG CAPS 1 tablet   pantoprazole (PROTONIX) 20  MG tablet Take 20 mg by mouth daily.   phenylephrine (SUDAFED PE) 10 MG TABS tablet Take 10 mg by mouth every 4 (four) hours as needed.   pregabalin (LYRICA) 75 MG capsule Take 75 mg by mouth daily.   rosuvastatin (CRESTOR) 20 MG tablet TAKE ONE TABLET BY MOUTH ONCE DAILY   Thiamine HCl (VITAMIN B1) 100 MG TABS 1 tablet   vitamin B-12 (CYANOCOBALAMIN) 100 MCG tablet See admin instructions.   vitamin E 45 MG (100 UNITS) capsule See admin instructions.   [DISCONTINUED] clopidogrel (PLAVIX) 75 MG tablet TAKE ONE TABLET BY MOUTH ONCE DAILY     Allergies:    Aspirin, Milk-related compounds, and Tramadol   Social History: Social History   Socioeconomic History   Marital status: Married    Spouse name: Not on file   Number of children: 2   Years of  education: Not on file   Highest education level: Not on file  Occupational History   Occupation: retired    Fish farm manager: [employer]    Comment: [employer]   Tobacco Use   Smoking status: Former    Packs/day: 0.50    Years: 15.00    Total pack years: 7.50    Types: Cigarettes    Quit date: 02/27/1985    Years since quitting: 37.2   Smokeless tobacco: Never  Substance and Sexual Activity   Alcohol use: No   Drug use: No   Sexual activity: Not on file  Other Topics Concern   Not on file  Social History Narrative   Not on file   Social Determinants of Health   Financial Resource Strain: Not on file  Food Insecurity: Not on file  Transportation Needs: Not on file  Physical Activity: Not on file  Stress: Not on file  Social Connections: Not on file     Family History: The patient's family history includes Asthma in her daughter; Breast cancer in her maternal grandmother and mother; Cancer in her brother.  ROS:   All other ROS reviewed and negative. Pertinent positives noted in the HPI.     EKGs/Labs/Other Studies Reviewed:   The following studies were personally reviewed by me today:  Carotid US 07/19/2021 Summary:  Right Carotid: There is no evidence of stenosis in the right ICA. The                 extracranial vessels were near-normal with only minimal  wall                thickening or plaque.   Left Carotid: Velocities in the left ICA are consistent with a 1-39%  stenosis.   TTE 10/31/2019  1. Left ventricular ejection fraction, by estimation, is 60 to 65%. The  left ventricle has normal function. The left ventricle has no regional  wall motion abnormalities. Left ventricular diastolic parameters were  normal.   2. Right ventricular systolic function is normal. The right ventricular  size is normal.   3. The mitral valve is normal in structure. Trivial mitral valve  regurgitation. No evidence of mitral stenosis.   4. The aortic valve is tricuspid. Aortic valve regurgitation  is not  visualized. Mild aortic valve sclerosis is present, with no evidence of  aortic valve stenosis.   5. The inferior vena cava is normal in size with greater than 50%  respiratory variability, suggesting right atrial pressure of 3 mmHg.     Recent Labs: No results found for requested labs within last 365 days.  Recent Lipid Panel    Component Value Date/Time   CHOL  05/19/2009 0603    134        ATP III CLASSIFICATION:  <200     mg/dL   Desirable  200-239  mg/dL   Borderline High  >=240    mg/dL   High          TRIG 81 05/19/2009 0603   HDL 43 05/19/2009 0603   CHOLHDL 3.1 05/19/2009 0603   VLDL 16 05/19/2009 0603   LDLCALC  05/19/2009 0603    75        Total Cholesterol/HDL:CHD Risk Coronary Heart Disease Risk Table                     Men   Women  1/2 Average Risk   3.4   3.3  Average Risk       5.0   4.4  2 X Average Risk   9.6   7.1  3 X Average Risk  23.4   11.0        Use the calculated Patient Ratio above and the CHD Risk Table to determine the patient's CHD Risk.        ATP III CLASSIFICATION (LDL):  <100     mg/dL   Optimal  100-129  mg/dL   Near or Above                    Optimal  130-159  mg/dL   Borderline  160-189  mg/dL   High  >190     mg/dL   Very High    Physical Exam:   VS:  BP (!) 148/72   Pulse 62   Ht 5' 9.5" (1.765 m)   Wt 240 lb (108.9 kg)   SpO2 97%   BMI 34.93 kg/m    Wt Readings from Last 3 Encounters:  05/05/22 240 lb (108.9 kg)  12/21/21 233 lb (105.7 kg)  07/15/21 230 lb (104.3 kg)    General: Well nourished, well developed, in no acute distress Head: Atraumatic, normal size  Eyes: PEERLA, EOMI  Neck: Supple, no JVD Endocrine: No thryomegaly Cardiac: Normal S1, S2; RRR; no murmurs, rubs, or gallops Lungs: Clear to auscultation bilaterally, no wheezing, rhonchi or rales  Abd: Soft, nontender, no hepatomegaly  Ext: No edema, pulses 2+ Musculoskeletal: No deformities, BUE and BLE strength normal and equal Skin:  Warm and dry, no rashes   Neuro: Alert and oriented to person, place, time, and situation, CNII-XII grossly intact, no focal deficits  Psych: Normal mood and affect   ASSESSMENT:   Kelly Watson is a 72 y.o. female who presents for the following: 1. Palpitations   2. Mixed hyperlipidemia   3. Stenosis of left carotid artery     PLAN:   1. Palpitations 2. Mixed hyperlipidemia 3. Stenosis of left carotid artery -Symptoms of palpitations have occurred with stress.  Seems to be doing well.  No further episodes.  Continues to have noncardiac chest pain.  Offered coronary CTA but she would like to wait.  She has minimal carotid artery disease.  Cholesterol level is at goal.  Would continue statin.  Okay to stop Plavix.  She will see [place] yearly.  Keep an eye on her blood pressure.      Disposition: Return in about 1 year (around 05/05/2023).  Medication Adjustments/Labs and Tests Ordered: Current medicines are reviewed at length with the patient today.  Concerns regarding medicines  are outlined above.  No orders of the defined types were placed in this encounter.  No orders of the defined types were placed in this encounter.   Patient Instructions  Medication Instructions:  STOP Plavix   *If you need a refill on your cardiac medications before your next appointment, please call your pharmacy*  Follow-Up: At [facility], you and your health needs are our priority.  As part of our continuing mission to provide you with exceptional heart care, we have created designated Provider Care Teams.  These Care Teams include your primary Cardiologist (physician) and Advanced Practice Providers (APPs -  Physician Assistants and Nurse Practitioners) who all work together to provide you with the care you need, when you need it.  We recommend signing up for the patient portal called "MyChart".  Sign up information is provided on this After Visit Summary.  MyChart is used to connect with  patients for Virtual Visits (Telemedicine).  Patients are able to view lab/test results, encounter notes, upcoming appointments, etc.  Non-urgent messages can be sent to your provider as well.   To learn more about what you can do with MyChart, go to [email or web address].    Your next appointment:   12 month(s)  Provider:   Almyra Deforest, PA-C, Sande Rives, PA-C, or Diona Browner, NP        Time Spent with Patient: I have spent a total of 25 minutes with patient reviewing hospital notes, telemetry, EKGs, labs and examining the patient as well as establishing an assessment and plan that was discussed with the patient.  > 50% of time was spent in direct patient care.  Signed, Addison Naegeli. Audie Box, MD, [place]  [address], [place], [place] [address] [phone]  05/05/2022 9:10 AM

## 2022-05-05 ENCOUNTER — Encounter: Payer: Self-pay | Admitting: Cardiovascular Disease

## 2022-05-05 ENCOUNTER — Ambulatory Visit: Payer: PPO | Attending: Cardiovascular Disease | Admitting: Cardiovascular Disease

## 2022-05-05 VITALS — BP 148/72 | HR 62 | Ht 69.5 in | Wt 240.0 lb

## 2022-05-05 DIAGNOSIS — E782 Mixed hyperlipidemia: Secondary | ICD-10-CM

## 2022-05-05 DIAGNOSIS — R002 Palpitations: Secondary | ICD-10-CM | POA: Diagnosis not present

## 2022-05-05 DIAGNOSIS — I6522 Occlusion and stenosis of left carotid artery: Secondary | ICD-10-CM

## 2022-05-05 NOTE — Patient Instructions (Signed)
Medication Instructions:  STOP Plavix   *If you need a refill on your cardiac medications before your next appointment, please call your pharmacy*  Follow-Up: At [facility], you and your health needs are our priority.  As part of our continuing mission to provide you with exceptional heart care, we have created designated Provider Care Teams.  These Care Teams include your primary Cardiologist (physician) and Advanced Practice Providers (APPs -  Physician Assistants and Nurse Practitioners) who all work together to provide you with the care you need, when you need it.  We recommend signing up for the patient portal called "MyChart".  Sign up information is provided on this After Visit Summary.  MyChart is used to connect with patients for Virtual Visits (Telemedicine).  Patients are able to view lab/test results, encounter notes, upcoming appointments, etc.  Non-urgent messages can be sent to your provider as well.   To learn more about what you can do with MyChart, go to [email or web address].    Your next appointment:   12 month(s)  Provider:   Almyra Deforest, PA-C, Sande Rives, PA-C, or Diona Browner, NP

## 2022-05-08 DIAGNOSIS — Z0000 Encounter for general adult medical examination without abnormal findings: Secondary | ICD-10-CM | POA: Diagnosis not present

## 2022-05-08 DIAGNOSIS — F411 Generalized anxiety disorder: Secondary | ICD-10-CM | POA: Diagnosis not present

## 2022-05-08 DIAGNOSIS — M47817 Spondylosis without myelopathy or radiculopathy, lumbosacral region: Secondary | ICD-10-CM | POA: Diagnosis not present

## 2022-05-08 DIAGNOSIS — E538 Deficiency of other specified B group vitamins: Secondary | ICD-10-CM | POA: Diagnosis not present

## 2022-05-08 DIAGNOSIS — I10 Essential (primary) hypertension: Secondary | ICD-10-CM | POA: Diagnosis not present

## 2022-05-08 DIAGNOSIS — E559 Vitamin D deficiency, unspecified: Secondary | ICD-10-CM | POA: Diagnosis not present

## 2022-05-08 DIAGNOSIS — E7800 Pure hypercholesterolemia, unspecified: Secondary | ICD-10-CM | POA: Diagnosis not present

## 2022-05-08 DIAGNOSIS — E039 Hypothyroidism, unspecified: Secondary | ICD-10-CM | POA: Diagnosis not present

## 2022-05-08 DIAGNOSIS — N1832 Chronic kidney disease, stage 3b: Secondary | ICD-10-CM | POA: Diagnosis not present

## 2022-05-08 DIAGNOSIS — R7303 Prediabetes: Secondary | ICD-10-CM | POA: Diagnosis not present

## 2022-05-08 DIAGNOSIS — Z86711 Personal history of pulmonary embolism: Secondary | ICD-10-CM | POA: Diagnosis not present

## 2022-05-08 DIAGNOSIS — K219 Gastro-esophageal reflux disease without esophagitis: Secondary | ICD-10-CM | POA: Diagnosis not present

## 2022-05-10 DIAGNOSIS — M6281 Muscle weakness (generalized): Secondary | ICD-10-CM | POA: Diagnosis not present

## 2022-05-10 DIAGNOSIS — M25612 Stiffness of left shoulder, not elsewhere classified: Secondary | ICD-10-CM | POA: Diagnosis not present

## 2022-05-10 DIAGNOSIS — M7542 Impingement syndrome of left shoulder: Secondary | ICD-10-CM | POA: Diagnosis not present

## 2022-06-19 DIAGNOSIS — M2550 Pain in unspecified joint: Secondary | ICD-10-CM | POA: Diagnosis not present

## 2022-06-19 DIAGNOSIS — Z6835 Body mass index (BMI) 35.0-35.9, adult: Secondary | ICD-10-CM | POA: Diagnosis not present

## 2022-06-21 DIAGNOSIS — M25571 Pain in right ankle and joints of right foot: Secondary | ICD-10-CM | POA: Diagnosis not present

## 2022-06-21 DIAGNOSIS — E039 Hypothyroidism, unspecified: Secondary | ICD-10-CM | POA: Diagnosis not present

## 2022-06-21 DIAGNOSIS — M79672 Pain in left foot: Secondary | ICD-10-CM | POA: Diagnosis not present

## 2022-06-21 DIAGNOSIS — E785 Hyperlipidemia, unspecified: Secondary | ICD-10-CM | POA: Diagnosis not present

## 2022-06-21 DIAGNOSIS — M79642 Pain in left hand: Secondary | ICD-10-CM | POA: Diagnosis not present

## 2022-06-21 DIAGNOSIS — R5383 Other fatigue: Secondary | ICD-10-CM | POA: Diagnosis not present

## 2022-06-21 DIAGNOSIS — M2550 Pain in unspecified joint: Secondary | ICD-10-CM | POA: Diagnosis not present

## 2022-06-21 DIAGNOSIS — M791 Myalgia, unspecified site: Secondary | ICD-10-CM | POA: Diagnosis not present

## 2022-06-21 DIAGNOSIS — M25572 Pain in left ankle and joints of left foot: Secondary | ICD-10-CM | POA: Diagnosis not present

## 2022-06-21 DIAGNOSIS — M79641 Pain in right hand: Secondary | ICD-10-CM | POA: Diagnosis not present

## 2022-06-21 DIAGNOSIS — M79671 Pain in right foot: Secondary | ICD-10-CM | POA: Diagnosis not present

## 2022-11-02 DIAGNOSIS — Z6835 Body mass index (BMI) 35.0-35.9, adult: Secondary | ICD-10-CM | POA: Diagnosis not present

## 2022-11-02 DIAGNOSIS — I739 Peripheral vascular disease, unspecified: Secondary | ICD-10-CM | POA: Diagnosis not present

## 2022-11-02 DIAGNOSIS — N1832 Chronic kidney disease, stage 3b: Secondary | ICD-10-CM | POA: Diagnosis not present

## 2022-11-02 DIAGNOSIS — R42 Dizziness and giddiness: Secondary | ICD-10-CM | POA: Diagnosis not present

## 2022-11-02 DIAGNOSIS — F411 Generalized anxiety disorder: Secondary | ICD-10-CM | POA: Diagnosis not present

## 2022-11-02 DIAGNOSIS — M5136 Other intervertebral disc degeneration, lumbar region: Secondary | ICD-10-CM | POA: Diagnosis not present

## 2022-11-02 DIAGNOSIS — R519 Headache, unspecified: Secondary | ICD-10-CM | POA: Diagnosis not present

## 2022-11-02 DIAGNOSIS — I10 Essential (primary) hypertension: Secondary | ICD-10-CM | POA: Diagnosis not present

## 2022-11-02 DIAGNOSIS — M47817 Spondylosis without myelopathy or radiculopathy, lumbosacral region: Secondary | ICD-10-CM | POA: Diagnosis not present

## 2022-11-02 DIAGNOSIS — R0602 Shortness of breath: Secondary | ICD-10-CM | POA: Diagnosis not present

## 2022-11-02 DIAGNOSIS — E039 Hypothyroidism, unspecified: Secondary | ICD-10-CM | POA: Diagnosis not present

## 2022-11-02 DIAGNOSIS — E538 Deficiency of other specified B group vitamins: Secondary | ICD-10-CM | POA: Diagnosis not present

## 2022-11-02 DIAGNOSIS — E7800 Pure hypercholesterolemia, unspecified: Secondary | ICD-10-CM | POA: Diagnosis not present

## 2022-11-16 ENCOUNTER — Other Ambulatory Visit: Payer: Self-pay | Admitting: *Deleted

## 2022-11-16 DIAGNOSIS — M7989 Other specified soft tissue disorders: Secondary | ICD-10-CM

## 2022-11-23 DIAGNOSIS — M5416 Radiculopathy, lumbar region: Secondary | ICD-10-CM | POA: Diagnosis not present

## 2022-11-26 NOTE — Progress Notes (Unsigned)
[facility] [place]  ASSESSMENT / PLAN: 72 y.o. female with chronic venous insufficiency of bilateral lower extremities.  This is causing swelling (C3 disease).  The patient does not find this disabling.  I reviewed her venous duplex with her in detail.  She does have superficial venous reflux as well as deep venous reflux.  I counseled her that swelling may or may not improve with endovenous ablation.  Patient is mostly interested in strategies to treat this and would like to avoid intervention if possible.  I counseled her this was a reasonable goal.  We will start her in compression and elevation therapies.  I encouraged her to try to lose weight.  She can follow-up with me as needed should she develop worsening venous disease.  CHIEF COMPLAINT: leg swelling  HISTORY OF PRESENT ILLNESS: Kelly Watson is a 72 y.o. female referred to clinic for evaluation of chronic venous insufficiency of the bilateral lower extremities.  The patient reports fairly typical symptoms of chronic venous insufficiency.  She reports progressive swelling over the course of the day which is most noticeable about the ankle and foot.  Upon waking in the morning, her swelling is improved, but not resolved. She suffered a PE after knee surgery, but a lower extremity DVT was never found.    Past Medical History:  Diagnosis Date   Anxiety    Carotid artery occlusion    Hypercholesterolemia    Hypertension    Hypothyroidism    Personal history of pulmonary embolism 2011   Right knee DJD    Trigger ring finger of left hand 08/18/2014    Past Surgical History:  Procedure Laterality Date   ABDOMINAL HYSTERECTOMY     for fibroid   KNEE SURGERY  2011   left total knee  Pulmonary Embolism post op   SHOULDER SURGERY     right rotator cuff   THYROIDECTOMY     partial; due to benign thyroid nodule.    TOTAL KNEE ARTHROPLASTY  04/01/2012   RIGHT KNEE  Dr Bertram Gala   TOTAL KNEE ARTHROPLASTY   04/01/2012   Procedure: TOTAL KNEE ARTHROPLASTY;  Surgeon: Nilda Simmer, MD;  Location: [facility] OR;  Service: Orthopedics;  Laterality: Right;   TRIGGER FINGER RELEASE Left 08/25/2014   Procedure: LEFT 4TH FINGER TRIGGER RELEASE;  Surgeon: Salvatore Marvel, MD;  Location:  SUR[facility]ERY [facility];  Service: Orthopedics;  Laterality: Left;   TUBAL LIGATION      Family History  Problem Relation Age of Onset   Breast cancer Mother    Cancer Brother        prostate   Asthma Daughter    Breast cancer Maternal Grandmother     Social History   Socioeconomic History   Marital status: Married    Spouse name: Not on file   Number of children: 2   Years of education: Not on file   Highest education level: Not on file  Occupational History   Occupation: retired    Associate Professor: [employer]    Comment: [employer]   Tobacco Use   Smoking status: Former    Current packs/day: 0.00    Average packs/day: 0.5 packs/day for 15.0 years (7.5 ttl pk-yrs)    Types: Cigarettes    Start date: 02/27/1970    Quit date: 02/27/1985    Years since quitting: 37.7   Smokeless tobacco: Never  Substance and Sexual Activity   Alcohol use: No   Drug use: No   Sexual activity: Not  on file  Other Topics Concern   Not on file  Social History Narrative   Not on file   Social Determinants of Health   Financial Resource Strain: Not on file  Food Insecurity: Not on file  Transportation Needs: Not on file  Physical Activity: Not on file  Stress: Not on file  Social Connections: Not on file  Intimate Partner Violence: Not on file    Allergies  Allergen Reactions   Gabapentin Rash   Aspirin Hives   Milk-Related Compounds    Tramadol     Over sedates    Current Outpatient Medications  Medication Sig Dispense Refill   acetaminophen (TYLENOL) 325 MG tablet Take 2 tablets (650 mg total) by mouth every 6 (six) hours as needed for pain (or Fever >/= 101).     diphenhydramine-acetaminophen (TYLENOL PM) 25-500 MG TABS tablet Take  1 tablet by mouth at bedtime.     fluticasone (FLONASE) 50 MCG/ACT nasal spray INSTILL 2 SPRAYS INTO EACH NOSTRIL ONCE A DAY AS NEEDED     Ginkgo Biloba 40 MG TABS 2 tablets     levothyroxine (SYNTHROID) 50 MCG tablet Take 50 mcg by mouth every morning.     LORazepam (ATIVAN) 0.5 MG tablet Take 1 tablet by mouth every 8 (eight) hours as needed. anxiety     metoprolol succinate (TOPROL-XL) 50 MG 24 hr tablet Take 50 mg by mouth daily. Take with or immediately following a meal.      Multiple Vitamin (MULTIVITAMIN) tablet Take 2 tablets by mouth daily.     Omega-3 Fatty Acids (FISH OIL) 600 MG CAPS 1 tablet     pantoprazole (PROTONIX) 20 MG tablet Take 20 mg by mouth daily.     phenylephrine (SUDAFED PE) 10 MG TABS tablet Take 10 mg by mouth every 4 (four) hours as needed.     pregabalin (LYRICA) 75 MG capsule Take 75 mg by mouth daily.     rosuvastatin (CRESTOR) 20 MG tablet TAKE ONE TABLET BY MOUTH ONCE DAILY 90 tablet 2   Thiamine HCl (VITAMIN B1) 100 MG TABS 1 tablet     vitamin B-12 (CYANOCOBALAMIN) 100 MCG tablet See admin instructions.     vitamin E 45 MG (100 UNITS) capsule See admin instructions.     No current facility-administered medications for this visit.    PHYSICAL EXAM Vitals:   11/27/22 0955  BP: (!) 142/71  Pulse: 63  Temp: 97.8 F (36.6 C)  TempSrc: Temporal  SpO2: 100%  Weight: 242 lb 14.4 oz (110.2 kg)  Height: 5\' 9"  (1.753 m)   Well-appearing woman in no distress Regular rate and rhythm Unlabored breathing 2+ pitting edema about the ankles and feet bilaterally with scattered reticular veins No palpable pedal pulses    PERTINENT LABORATORY AND RADIOLOGIC DATA  Most recent CBC    Latest Ref Rng & Units 08/25/2014   10:43 AM 07/19/2012    8:57 AM 04/08/2012    9:58 AM  CBC  WBC 3.9 - 10.3 10e3/uL  6.0  9.5   Hemoglobin 12.0 - 15.0 g/dL 16.1  09.6  04.5   Hematocrit 36.0 - 46.0 % 43.0  37.5  31.0   Platelets 145 - 400 10e3/uL  199  272      Most  recent CMP    Latest Ref Rng & Units 08/25/2014   10:43 AM 04/02/2012    4:05 AM 04/01/2012    1:30 PM  CMP  Glucose 65 - 99 mg/dL 86  129    BUN 6 - 20 mg/dL 14  12    Creatinine 5.62 - 1.00 mg/dL 1.30  8.65  7.84   Sodium 135 - 145 mmol/L 141  139    Potassium 3.5 - 5.1 mmol/L 4.1  4.1    Chloride 101 - 111 mmol/L 104  105    CO2 19 - 32 mEq/L  25    Calcium 8.4 - 10.5 mg/dL  8.8      Renal function CrCl cannot be calculated (Patient's most recent lab result is older than the maximum 21 days allowed.).  No results found for: "HGBA1C"  LDL Cholesterol  Date Value Ref Range Status  05/19/2009  0 - 99 mg/dL Final   75        Total Cholesterol/HDL:CHD Risk Coronary Heart Disease Risk Table                     Men   Women  1/2 Average Risk   3.4   3.3  Average Risk       5.0   4.4  2 X Average Risk   9.6   7.1  3 X Average Risk  23.4   11.0        Use the calculated Patient Ratio above and the CHD Risk Table to determine the patient's CHD Risk.        ATP III CLASSIFICATION (LDL):  <100     mg/dL   Optimal  696-295  mg/dL   Near or Above                    Optimal  130-159  mg/dL   Borderline  284-132  mg/dL   High  >440     mg/dL   Very High    Left lower extremity venous reflux study:  - No evidence of deep vein thrombosis seen in the left lower extremity,  from the common femoral through the popliteal veins.  - No evidence of superficial venous reflux seen in the left short  saphenous vein.  - Venous reflux is noted in the left common femoral vein.  - Venous reflux is noted in the left sapheno-femoral junction.  - Venous reflux is noted in the left greater saphenous vein in the thigh.  - Venous reflux is noted in the left greater saphenous vein in the calf.  - Venous reflux is noted in the left popliteal vein.  - Venous reflux is noted in the AASV origin.   Rande Brunt. Lenell Antu, MD FACS [facility] Phone Number: [phone] 11/27/2022 10:42 AM   Total time spent on preparing this encounter including chart review, data review, collecting history, examining the patient, coordinating care for this new patient, 60 minutes.  Portions of this report may have been transcribed using voice recognition software.  Every effort has been made to ensure accuracy; however, inadvertent computerized transcription errors may still be present.

## 2022-11-27 ENCOUNTER — Ambulatory Visit: Payer: PPO | Admitting: Vascular Surgery

## 2022-11-27 ENCOUNTER — Ambulatory Visit (HOSPITAL_COMMUNITY)
Admission: RE | Admit: 2022-11-27 | Discharge: 2022-11-27 | Disposition: A | Discharge status: Home / Self Care | Payer: PPO | Source: Ambulatory Visit | Attending: Vascular Surgery | Admitting: Vascular Surgery

## 2022-11-27 VITALS — BP 142/71 | HR 63 | Temp 97.8°F | Ht 69.0 in | Wt 242.9 lb

## 2022-11-27 DIAGNOSIS — M7989 Other specified soft tissue disorders: Secondary | ICD-10-CM | POA: Insufficient documentation

## 2022-11-27 DIAGNOSIS — I872 Venous insufficiency (chronic) (peripheral): Secondary | ICD-10-CM | POA: Diagnosis not present

## 2022-11-30 DIAGNOSIS — M5416 Radiculopathy, lumbar region: Secondary | ICD-10-CM | POA: Diagnosis not present

## 2022-12-21 DIAGNOSIS — M7062 Trochanteric bursitis, left hip: Secondary | ICD-10-CM | POA: Diagnosis not present

## 2022-12-29 DIAGNOSIS — M7062 Trochanteric bursitis, left hip: Secondary | ICD-10-CM | POA: Diagnosis not present

## 2023-03-12 ENCOUNTER — Other Ambulatory Visit: Payer: Self-pay | Admitting: Family Medicine

## 2023-03-12 DIAGNOSIS — Z1231 Encounter for screening mammogram for malignant neoplasm of breast: Secondary | ICD-10-CM

## 2023-04-06 ENCOUNTER — Ambulatory Visit
Admission: RE | Admit: 2023-04-06 | Discharge: 2023-04-06 | Disposition: A | Discharge status: Home / Self Care | Payer: PPO | Source: Ambulatory Visit | Attending: Family Medicine | Admitting: Family Medicine

## 2023-04-06 DIAGNOSIS — Z1231 Encounter for screening mammogram for malignant neoplasm of breast: Secondary | ICD-10-CM

## 2023-05-03 ENCOUNTER — Telehealth: Payer: Self-pay

## 2023-05-03 NOTE — Patient Outreach (Signed)
 Aging Gracefully Program  05/03/2023  Kelly Watson 01/19/1951 [id number]   [facility] Evaluation Interviewer made contact with patient. Aging Gracefully survey completed.   Interviewer will send referral to RN and OT for follow up.    [name]  Population Health Care Management Assistant  Direct Dial: [phone]  Fax: [phone] Website: [email or web address]

## 2023-05-07 NOTE — Progress Notes (Unsigned)
 Cardiology Office Note:  .   Date:  05/08/2023  ID:  Kelly Watson, DOB Oct 25, 1950, MRN [id number] PCP: Kelly Pilgrim, NP  [facility] Providers Cardiologist:  Reatha Harps, MD    History of Present Illness: .    Chief Complaint  Patient presents with   Follow-up         Kelly Watson is a 73 y.o. female with history of HTN, HLD who presents for follow-up.    History of Present Illness   The patient is a 73 year old with venous insufficiency who presents for a follow-up.  She has a history of venous insufficiency and manages her condition with compression stockings, which she wears from morning to night. The stockings help reduce swelling, although her legs still swell depending on her activity level. She elevates her legs for about fifteen minutes daily, which she finds helpful. She is not currently on a diuretic.  Her blood pressure was elevated at 155/66 during the visit, but her home readings are usually around 136/78. She is currently taking metoprolol succinate 50 mg daily.  She has a history of minimal carotid artery disease and is no longer taking Plavix due to an aspirin allergy. Her most recent LDL cholesterol level was 83, and she continues to take Crestor.  She experiences chest wall pain, which she describes as not related to her heart. The pain occurs with certain activities, such as lifting, and is relieved somewhat by acetaminophen. The pain worsens when lying down at night, and while Tylenol PM helps her sleep, it does not significantly alleviate the pain. No chest pressure, palpitations, or shortness of breath. She occasionally experiences indigestion, for which she takes medication, though she cannot recall the name.          Problem List 1. Carotid Artery disease -1-39% bilaterally  2. HTN 3. HLD -T chol 174, HDL 65, LDL 83, TG 154 4. Venous insufficiency     ROS: All other ROS reviewed and negative. Pertinent positives noted in  the HPI.     Studies Reviewed: Marland Kitchen   EKG Interpretation Date/Time:  Tuesday May 08 2023 09:31:45 EDT Ventricular Rate:  68 PR Interval:  176 QRS Duration:  92 QT Interval:  406 QTC Calculation: 431 R Axis:   13  Text Interpretation: Normal sinus rhythm Possible Left atrial enlargement Left ventricular hypertrophy ( R in aVL , Cornell product ) Confirmed by Lennie Odor [phone]) on 05/08/2023 9:36:28 AM   Carotid US 07/19/2021 Summary:  Right Carotid: There is no evidence of stenosis in the right ICA. The                 extracranial vessels were near-normal with only minimal  wall                thickening or plaque.   Left Carotid: Velocities in the left ICA are consistent with a 1-39%  stenosis.  Physical Exam:   VS:  BP (!) 158/66   Pulse 68   Ht 5\' 9"  (1.753 m)   Wt 251 lb (113.9 kg)   SpO2 98%   BMI 37.07 kg/m    Wt Readings from Last 3 Encounters:  05/08/23 251 lb (113.9 kg)  11/27/22 242 lb 14.4 oz (110.2 kg)  05/05/22 240 lb (108.9 kg)    GEN: Well nourished, well developed in no acute distress NECK: No JVD; No carotid bruits CARDIAC: RRR, no murmurs, rubs, gallops RESPIRATORY:  Clear to auscultation without rales,  wheezing or rhonchi  ABDOMEN: Soft, non-tender, non-distended EXTREMITIES:  trace edema   ASSESSMENT AND PLAN: .   Assessment and Plan    Venous Insufficiency Chronic venous insufficiency with controlled symptoms but persistent swelling based on activity. Diuretics discussed as a last resort. - Continue daily compression stockings. - Consider diuretics as needed if swelling worsens.  Chest Wall Pain Intermittent musculoskeletal chest wall pain, reproducible with palpation, relieved by acetaminophen. No cardiac symptoms. Normal EKG with LVH. Coronary CTA deferred. - Monitor chest wall pain and use acetaminophen as needed. - Consider coronary CTA if symptoms change or worsen.  Hypertension Blood pressure elevated in office, acceptable at home.  Managed with metoprolol succinate. Prefers minimal medication. - Continue metoprolol succinate 50 mg daily. - Monitor blood pressure at home.  Carotid Artery Disease Minimal carotid artery disease. LDL cholesterol controlled at 83. Not on Plavix due to aspirin allergy. - Continue current management and monitor lipid levels.              Follow-up: Return in about 1 year (around 05/07/2024).   Signed, Lenna Gilford. Flora Lipps, MD, [facility]  [facility]  [address] [place], [place] [address] [phone]  2:12 PM

## 2023-05-08 ENCOUNTER — Ambulatory Visit: Payer: PPO | Attending: Cardiovascular Disease | Admitting: Cardiovascular Disease

## 2023-05-08 ENCOUNTER — Encounter: Payer: Self-pay | Admitting: Cardiovascular Disease

## 2023-05-08 VITALS — BP 158/66 | HR 68 | Ht 69.0 in | Wt 251.0 lb

## 2023-05-08 DIAGNOSIS — R079 Chest pain, unspecified: Secondary | ICD-10-CM | POA: Diagnosis not present

## 2023-05-08 DIAGNOSIS — E782 Mixed hyperlipidemia: Secondary | ICD-10-CM | POA: Diagnosis not present

## 2023-05-08 DIAGNOSIS — I872 Venous insufficiency (chronic) (peripheral): Secondary | ICD-10-CM | POA: Diagnosis not present

## 2023-05-08 DIAGNOSIS — I150 Renovascular hypertension: Secondary | ICD-10-CM

## 2023-05-08 NOTE — Patient Instructions (Signed)
Medication Instructions:  ?Your physician recommends that you continue on your current medications as directed. Please refer to the Current Medication list given to you today.  ?*If you need a refill on your cardiac medications before your next appointment, please call your pharmacy* ? ? ?Lab Work: ?None ?If you have labs (blood work) drawn today and your tests are completely normal, you will receive your results only by: ?MyChart Message (if you have MyChart) OR ?A paper copy in the mail ?If you have any lab test that is abnormal or we need to change your treatment, we will call you to review the results. ? ? ?Testing/Procedures: ?None ? ? ?Follow-Up: ?At [facility], you and your health needs are our priority.  As part of our continuing mission to provide you with exceptional heart care, we have created designated Provider Care Teams.  These Care Teams include your primary Cardiologist (physician) and Advanced Practice Providers (APPs -  Physician Assistants and Nurse Practitioners) who all work together to provide you with the care you need, when you need it. ? ?We recommend signing up for the patient portal called "MyChart".  Sign up information is provided on this After Visit Summary.  MyChart is used to connect with patients for Virtual Visits (Telemedicine).  Patients are able to view lab/test results, encounter notes, upcoming appointments, etc.  Non-urgent messages can be sent to your provider as well.   ?To learn more about what you can do with MyChart, go to [email or web address].   ? ?Your next appointment:   ?1 year(s) ? ?The format for your next appointment:   ?In Person ? ?Provider:   ?Evalina Field, MD  ? ? ?Other Instructions ?  ?

## 2023-05-14 DIAGNOSIS — E039 Hypothyroidism, unspecified: Secondary | ICD-10-CM | POA: Diagnosis not present

## 2023-05-14 DIAGNOSIS — I10 Essential (primary) hypertension: Secondary | ICD-10-CM | POA: Diagnosis not present

## 2023-05-14 DIAGNOSIS — E7800 Pure hypercholesterolemia, unspecified: Secondary | ICD-10-CM | POA: Diagnosis not present

## 2023-05-14 DIAGNOSIS — K219 Gastro-esophageal reflux disease without esophagitis: Secondary | ICD-10-CM | POA: Diagnosis not present

## 2023-05-14 DIAGNOSIS — E538 Deficiency of other specified B group vitamins: Secondary | ICD-10-CM | POA: Diagnosis not present

## 2023-05-14 DIAGNOSIS — Z6835 Body mass index (BMI) 35.0-35.9, adult: Secondary | ICD-10-CM | POA: Diagnosis not present

## 2023-05-14 DIAGNOSIS — R0789 Other chest pain: Secondary | ICD-10-CM | POA: Diagnosis not present

## 2023-05-14 DIAGNOSIS — M47817 Spondylosis without myelopathy or radiculopathy, lumbosacral region: Secondary | ICD-10-CM | POA: Diagnosis not present

## 2023-05-14 DIAGNOSIS — I739 Peripheral vascular disease, unspecified: Secondary | ICD-10-CM | POA: Diagnosis not present

## 2023-05-14 DIAGNOSIS — E559 Vitamin D deficiency, unspecified: Secondary | ICD-10-CM | POA: Diagnosis not present

## 2023-05-14 DIAGNOSIS — F411 Generalized anxiety disorder: Secondary | ICD-10-CM | POA: Diagnosis not present

## 2023-05-18 ENCOUNTER — Other Ambulatory Visit: Payer: Self-pay | Admitting: Occupational Therapy

## 2023-05-20 NOTE — Patient Outreach (Signed)
 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 What Kind Of Help Do You Receive?: occasional A fro husband prn Functional Mobility-Maintain Balance While Showering: Maintaining Balance While Showering: A Little Difficulty Do You:: No Device/No Assistance Importance Of Learning New Strategies:: Very Much Safety: A Little Risk Efficiency: Somewhat Intervention: Yes Other Comments:: grab bars in shower and hand held shower head, shower seat maybe Functional Mobility-Reaching For Items Above Shoulder Level: Reaching For Items Above Shoulder Level: Moderate Difficulty Do You:: N/A- Not Applicable Importance Of Learning New Strategies:: Moderate Observation: Reaching For Items Above Shoulder Level:  (difficlty ready above 90 degrees) Safety: A Little Risk Efficiency: Somewhat Intervention: Yes Other Comments:: maybe a reacher Functional Mobility-Climb 1 Flight Of Stairs: Climb 1 Flight Of Stairs: Moderate Difficulty Do You:: Use A Device (rail) Importance Of Learning New Strategies:: Moderate Safety: Moderate/Extreme Risk Efficiency: Somewhat Intervention: Yes Other Comments:: extra handrail down the basement steps Functional Mobility-Move In And Out Of Bath/Shower: Move In And Out Of A Bath/Shower: Moderate Difficulty Do You:: No Device/No Assistance Importance Of Learning New Strategies:: Very Much Safety: A Little Risk Efficiency: Somewhat Intervention: Yes Other Comments:: grab bars in shower Activities of Daily Living-Bathing/Showering: ADL-Bathing/Showering: A Little Difficulty Activities of Daily Living-Personal Hygiene and  Grooming: Personal Hygiene and Grooming: Moderate Difficulty Do You:: N/A-Not Applicable Importance Of Learning New Strategies: Very Much ADL Observation: Personal Hygiene And Grooming:  (has trouble maintiaing RUE up to hold hair dryer and dry hair') Safety: No Risk Efficiency: Not At All Intervention: Yes Other Comments:: perhaps a hands free hair dryer stand Activities of Daily Living-Put On And Take Off Undergarments (Incl. Fasteners): Put On And Take Off Undergarments (Incl. Fasteners): A Lot of Difficulty Do You:: No Device/No Assistance Importance Of Learning New Strategies: Very Much Safety: No Risk Efficiency: Somewhat Intervention: Yes Other Comments:: techniques and AE for compression stockings  Readiness To Change Score:  Readiness to Change Score: 10  Home Environment Assessment: Outside Home Entry:: 2 steps onto porch and 1 into house. 1 hand rail on the right as you go into the house. Living Room:: A couple of places where the carpert is pucking, but seems to be in a symmetrical line across the living room floor Kitchen:: A couple of places where the flooring tiles are puckering and chipping Stairs:: to basement has only 1 rail on the steps on the right as you descent them Bathroom:: Hall bath; tub with curtain, comfort height toilet, no grab bars, no handheld shower head. Master bath: comfort height toilet, no working small walk in Restaurant manager, fast food:: Downstairs Other Home Environment Concerns:: water damage on ceilings in two rooms  Goals:  Goals Addressed               This Visit's Progress     Patient Stated        She would like to feel more safe with getting back and forth from car to house. A rail on both sides of their concrete ramp sidewalk will help with this.      Patient Stated (pt-stated)        She would like to feel  more safe in the hall bathroom. A tub to shower conversion or a tub cut out (if it works for her tub) would help with this; as well as  a hand held shower with lower shower holder and 3 grab bars. Also a grab bar vertically in front of the toilet attached to the vanity. Also checking to see if the area in the hall bath on the floor can be fixed where it is uneven.      Patient Stated (pt-stated)        Easier putting on and taking off compression stockings. Different techniques and/or devices may help.        Post Clinical Reasoning: Clinician View Of Client Situation:: This was my first visit with Mrs. Byrns (and her husband). She tries to stay active but with her OA in many joints this is not always the easiest for her. She recently (last 2ish weeks) started having issues with pain in her right elbow (her dominant arm) and that is also the arm where she has issues with her shoulder and hand (actually billaterally) due to OA. I advised her she may want to get it checked out sooner than later since she is alreadly limited in that arm. She also wears compression socks which are difficult to get on due to OA in her hands. Client View Of His/Her Situation:: Mrs.Coutant feels like she does well for herself and I agree. She still gets around well without an assistive device, is active church and enjoys time with family. Next Visit Plan:: Technqiues and device (s) for putting on and taking off compression stockings.  Lynden Ang, OT Aging Gracefully [phone]

## 2023-05-21 ENCOUNTER — Other Ambulatory Visit: Payer: Self-pay | Admitting: *Deleted

## 2023-05-21 NOTE — Patient Outreach (Signed)
 Aging Gracefully Program  05/21/2023  CRISTIAN GRIEVES March 12, 1950 [id number]      Telephone call made to Mrs. Mish to schedule Aging Gracefully RN home visit. Voicemail message left requesting return call.        Raiford Noble, MSN, RN, BSN [place]  [facility], Healthy Communities RN Case Manager for Aging Gracefully Direct Dial: [phone]

## 2023-05-22 ENCOUNTER — Other Ambulatory Visit: Payer: Self-pay | Admitting: *Deleted

## 2023-05-22 NOTE — Patient Outreach (Signed)
 Aging Gracefully Program  05/22/2023  Kelly Watson 05-10-1950 [id number]   Telephone call made to [name] to schedule Aging Gracefully RN home visit #1 for she and her husband. [name] agreeable to home visit for 06/06/23 at 10 am.    Writer provided contact information.     Raiford Noble, MSN, RN, BSN [place]  [facility], Healthy Communities RN Case Manager for Aging Gracefully Direct Dial: [phone]

## 2023-06-06 ENCOUNTER — Encounter: Payer: Self-pay | Admitting: *Deleted

## 2023-06-06 ENCOUNTER — Other Ambulatory Visit: Payer: Self-pay | Admitting: *Deleted

## 2023-06-06 NOTE — Patient Outreach (Signed)
 Aging Gracefully Program  RN Visit  06/06/2023  Kelly Watson Apr 15, 1950 [id number]  Visit:  RN Visit Number: 1- Initial Visit  RN TIME CALCULATION: Start TIme:  RN Start Time Calculation: 1000 End Time:  RN Stop Time Calculation: 1205 Total Minutes:  RN Time Calculation: 125  Readiness To Change Score:  Readiness to Change Score: 10  Universal RN Interventions: Calendar Distribution: Yes Exercise Review: No Medications: Yes Medication Changes: No Mood: Yes Pain: Yes PCP Advocacy/Support: No Fall Prevention: Yes Incontinence: Yes Clinician View Of Client Situation: Arrived for dual home visit with Mrs. Brizuela and husband. Home immaculent and without clutter. Thriving plants throughout the home. Mrs. Silberstein ambulates independently without assistive device. Client View Of His/Her Situation: Mrs. Osgood overall doing well. Reports recently getting over not feeling well. Reports she keeps busy most of the time. Enjoys working in her flower garden and tending to her plants.  Healthcare Provider Communication: Did Surveyor, mining With [employer] Provider?: No Healthcare Provider Response According to RN: N/A  Clinician View of Client Situation: Clinician View Of Client Situation: Arrived for dual home visit with Mrs. Doring and husband. Home immaculent and without clutter. Thriving plants throughout the home. Mrs. Declercq ambulates independently without assistive device. Client's View of His/Her Situation: Client View Of His/Her Situation: Mrs. Pickelsimer overall doing well. Reports recently getting over not feeling well. Reports she keeps busy most of the time. Enjoys working in her flower garden and tending to her plants.  Medication Assessment:  Do You Have Any Problems Paying For Medications?: No Where Does Client Store Medications?: Kitchen Table Can Client Read Pill Bottles?: Yes Does Client Use A Pillbox?: Yes Does Anyone Assist Client In  Filling Pillbox?: No Does Anyone Assist Client In Taking Medications?: No Do You Take Vitamin D?: Yes Total Number Of Medications That The Client Takes: 14 Does Client Have Any Questions Or Concerns About Medictions?: No Is Client Complaining Of Any Symptoms That Could Be Side Effects To Medications?: No Any Possible Changes In Medication Regimen?: No  OT Update: Pending [employer] assessments and contracts.   Session Summary: Performed joint home visit with Mrs. Krall and spouse. [name] pleasant and engaging. Overall doing well.    Goals Addressed               This Visit's Progress     AG RN (pt-stated)        06/06/2023  Assessment: Mrs. Bassinger enjoys tending to her flower gardens and plants. She enjoys going to the [facility] and doing Tai Chi. Mrs. Peden endorses staying busy doing house work and etc,  and rarely takes time to rest. Reports lower extremity swelling from vascular/circulatory condition. Wears compression stockings. Reports pain 2 out of 10. 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 Reports her doctor has advised her to elevate lower extremities for 15 minutes a day.   Interventions: Encouraged Mrs. Bart to take rest breaks throughout the day and to elevate her legs during rest breaks. Encouraged Mrs. Vandivier to continue hand stretches/exercises to aide in hand pain and discomfort from trigger finger and arthritis.  Commended [name] in staying active and for doing activities and exercises she enjoys. Provided chronic conditions booklet, calendar, and writer's contact information.  Plan: Scheduled next home visit for May 15th at 10 am.   CLIENT/RN ACTION PLAN - PAIN  Registered Nurse:  Raiford Noble Date: 06/06/23  Client Name: Kelly Watson Client ID:    Target Area:  PAIN  Why Problem May Occur: Lifting heavy things, standing too long on feet, and going up and down the stairs.     Target Goal:  Decrease pain levels and incorporate rest intervals during the day over the next 160 days.    STRATEGIES Coping Strategies Ideas:  Heat Reviewed 06/06/23 Use heating pad or warm towel on painful area no more than 20 minutes at a time. Don't sleep with a heating pad on - it could burn your skin   Ice Reviewed 06/06/23 Use ice pack or frozen bag of vegetables on painful area.   Leave cold pack on for less than 20 minutes. Ice can burn your skin.  Don't leave ice on longer than 20 minutes.   Activity and Exercise Reviewed 06/06/23 Joints get stiff when not in use Aging Gracefully Exercises Walking (inside or outside) [employer]:  cooking, cleaning, Building surveyor   Listen to Music Reviewed 06/06/23 Listening to music can decrease pain. Turn off the TV and turn on the radio.   Prayer/Meditation Reviewed 06/06/23 Prayer and meditation can decrease pain   Other   Acetaminophen/Tylenol (same medication) Reviewed 06/06/23 DO NOT take more Acetaminophen than below because it can be bad for your liver. 500 mg. tablets:  2 tablets every 8 hours, as needed for pain.  Do not take more than 6, (500 mg) tablets every day. 300 mg. tablets:  2 tablets every 6 hours, as needed for pain.  Do not take more than 8 (325 mg) tablets every day. Look for Acetaminophen/Tylenol in other medicines you buy over the counter.   Still in Pain Reviewed 06/06/23 There ae a lot of different kinds of pain medicines:  creams, patches and supplements. Ask your Healthcare Provider about other pain medications.   Stop Smoking N/A Smoking can make arthritis worse.   Stop Pain Reviewed 06/06/23 Before it gets bad. Once in pain It is harder to get rid of. Begin pain relief while you have mild pain.   Other   Other    ;  PRACTICE It is important to practice the strategies so we can determine if they will be effective in helping to reach the goal.    Follow these specific recommendations:         If strategy does not work the first time, try it again.  We may make some changes over the next few sessions.    We may make some changes over the next few sessions, based on how they work.    Raiford Noble, MSN, RN, BSN [place]  [facility], Healthy Communities RN Case Manager for Aging Gracefully Direct Dial: [phone]

## 2023-06-06 NOTE — Patient Instructions (Signed)
 Visit Information  Thank you for taking time to visit with me today. Please don't hesitate to contact me if I can be of assistance to you before our next scheduled home appointment.  Following are the goals we discussed today:  (Copy and paste patient goals from clinical care plan here)  Our next appointment is on Jul 12, 2023 at 10 am  If you are experiencing a Mental Health or Behavioral Health Crisis or need someone to talk to, please call the Suicide and Crisis Lifeline: 988 call the [place] National Suicide Prevention Lifeline: (501)142-9274 or TTY: 3077042155 TTY 704-391-0005) to talk to a trained counselor call 1-800-273-TALK (toll free, 24 hour hotline) go to [facility] [address] [place] [phone]) call 911   The patient verbalized understanding of instructions, educational materials, and care plan provided today and agreed to receive a mailed copy of patient instructions, educational materials, and care plan.    Goals Addressed               This Visit's Progress     AG RN (pt-stated)        06/06/2023  Assessment: Kelly Watson enjoys tending to her flower gardens and plants. She enjoys going to the [facility] and doing Tai Chi. Kelly Watson endorses staying busy doing house work and etc,  and rarely takes time to rest. Reports lower extremity swelling from vascular/circulatory condition. Wears compression stockings. Reports pain 2 out of 10. 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 Reports her doctor has advised her to elevate lower extremities for 15 minutes a day.   Interventions: Encouraged Kelly Watson to take rest breaks throughout the day and to elevate her legs during rest breaks. Encouraged Kelly Watson to continue hand stretches/exercises to aide in hand pain and discomfort from trigger finger and arthritis.  Commended Kelly Watson in staying active and for doing activities and exercises she  enjoys. Provided chronic conditions booklet, calendar, and writer's contact information.  Plan: Scheduled next home visit for May 15th at 10 am.   CLIENT/RN ACTION PLAN - PAIN  Registered Nurse:  Raiford Noble Date: 06/06/23  Client Name: Kelly Watson Client ID:    Target Area:  PAIN   Why Problem May Occur: Lifting heavy things, standing too long on feet, and going up and down the stairs.     Target Goal: Decrease pain levels and incorporate rest intervals during the day over the next 160 days.    STRATEGIES Coping Strategies Ideas:  Heat Reviewed 06/06/23 Use heating pad or warm towel on painful area no more than 20 minutes at a time. Don't sleep with a heating pad on - it could burn your skin   Ice Reviewed 06/06/23 Use ice pack or frozen bag of vegetables on painful area.   Leave cold pack on for less than 20 minutes. Ice can burn your skin.  Don't leave ice on longer than 20 minutes.   Activity and Exercise Reviewed 06/06/23 Joints get stiff when not in use Aging Gracefully Exercises Walking (inside or outside) [employer]:  cooking, cleaning, Building surveyor   Listen to Music Reviewed 06/06/23 Listening to music can decrease pain. Turn off the TV and turn on the radio.   Prayer/Meditation Reviewed 06/06/23 Prayer and meditation can decrease pain   Other   Acetaminophen/Tylenol (same medication) Reviewed 06/06/23 DO NOT take more Acetaminophen than below because it can be bad for your liver. 500 mg. tablets:  2 tablets  every 8 hours, as needed for pain.  Do not take more than 6, (500 mg) tablets every day. 300 mg. tablets:  2 tablets every 6 hours, as needed for pain.  Do not take more than 8 (325 mg) tablets every day. Look for Acetaminophen/Tylenol in other medicines you buy over the counter.   Still in Pain Reviewed 06/06/23 There ae a lot of different kinds of pain medicines:  creams, patches and supplements. Ask your Healthcare Provider about other  pain medications.   Stop Smoking N/A Smoking can make arthritis worse.   Stop Pain Reviewed 06/06/23 Before it gets bad. Once in pain It is harder to get rid of. Begin pain relief while you have mild pain.   Other   Other    ;  PRACTICE It is important to practice the strategies so we can determine if they will be effective in helping to reach the goal.    Follow these specific recommendations:        If strategy does not work the first time, try it again.  We may make some changes over the next few sessions.    We may make some changes over the next few sessions, based on how they work.    Raiford Noble, MSN, RN, BSN [place]  [facility], Healthy Communities RN Case Manager for Aging Gracefully Direct Dial: [phone]                                    Raiford Noble, MSN, RN, BSN [place]  [facility], Healthy Communities RN Case Manager for Aging Gracefully Direct Dial: [phone]

## 2023-06-25 ENCOUNTER — Other Ambulatory Visit: Payer: Self-pay | Admitting: Family Medicine

## 2023-06-25 DIAGNOSIS — F411 Generalized anxiety disorder: Secondary | ICD-10-CM | POA: Diagnosis not present

## 2023-06-25 DIAGNOSIS — Z6836 Body mass index (BMI) 36.0-36.9, adult: Secondary | ICD-10-CM | POA: Diagnosis not present

## 2023-06-25 DIAGNOSIS — Z0000 Encounter for general adult medical examination without abnormal findings: Secondary | ICD-10-CM | POA: Diagnosis not present

## 2023-06-25 DIAGNOSIS — K219 Gastro-esophageal reflux disease without esophagitis: Secondary | ICD-10-CM | POA: Diagnosis not present

## 2023-06-25 DIAGNOSIS — M8580 Other specified disorders of bone density and structure, unspecified site: Secondary | ICD-10-CM

## 2023-06-25 DIAGNOSIS — E039 Hypothyroidism, unspecified: Secondary | ICD-10-CM | POA: Diagnosis not present

## 2023-06-25 DIAGNOSIS — M47817 Spondylosis without myelopathy or radiculopathy, lumbosacral region: Secondary | ICD-10-CM | POA: Diagnosis not present

## 2023-06-25 DIAGNOSIS — E538 Deficiency of other specified B group vitamins: Secondary | ICD-10-CM | POA: Diagnosis not present

## 2023-06-25 DIAGNOSIS — E559 Vitamin D deficiency, unspecified: Secondary | ICD-10-CM | POA: Diagnosis not present

## 2023-06-25 DIAGNOSIS — I10 Essential (primary) hypertension: Secondary | ICD-10-CM | POA: Diagnosis not present

## 2023-06-25 DIAGNOSIS — E7800 Pure hypercholesterolemia, unspecified: Secondary | ICD-10-CM | POA: Diagnosis not present

## 2023-06-27 ENCOUNTER — Other Ambulatory Visit: Payer: Self-pay | Admitting: Occupational Therapy

## 2023-07-01 NOTE — Patient Outreach (Signed)
 Aging Gracefully Program  OT Follow-Up Visit  07/01/2023  LIZANDRA KITCHEL 05-21-1950 [id number]  Visit:  2- Second Visit  Start Time:  1200 End Time:  1230 Total Minutes:  30  Readiness to Change Score :  Readiness to Change Score: 10   Patient Education: Education Provided: Yes Education Details: Getting up from a fall and home safety checklist Person(s) Educated: Patient Comprehension: Verbalized Understanding  Goals:   Goals Addressed               This Visit's Progress     Patient Stated (pt-stated)   On track     Easier putting on and taking off compression stockings. Different techniques and/or devices may help. In Process: I had her try a compression sock donner and it did work for her but with a struggle.Will order a different one for her to see if will work better (one that is more slanted and has higher handles.        Post Clinical Reasoning: Client Action (Goal) Three Interventions: Provided a compression sock donner for patient--it made it a little better but that was still quite a bit of a struggle. Need to try a different one. Did Client Try?: Yes Targeted Problem Area Status: A Little Better Clinician View Of Client Situation:: Mrs. Weech continues to be active and works towards keeping herself healthy. She reports hers elbow pain is better on the right. Client View Of His/Her Situation:: She was optimistic about the compression sock donner, but it was still a struggle so she is opent to trying something different. She is excited about her vegetable garden for the summer. Next Visit Plan:: Checking to see if the next compression sock donner is workinng better and to follow up on any modifications that may have been completed.  Merryl Abraham OT Acute Rehabilitation Services Office [phone]

## 2023-07-12 ENCOUNTER — Encounter: Payer: Self-pay | Admitting: *Deleted

## 2023-07-12 ENCOUNTER — Other Ambulatory Visit: Payer: Self-pay | Admitting: *Deleted

## 2023-07-12 NOTE — Patient Instructions (Signed)
 Visit Information  Thank you for taking time to visit with me today. Please don't hesitate to contact me if I can be of assistance to you before our next scheduled home appointment.  Following are the goals we discussed today:   Goals Addressed               This Visit's Progress     AG RN (pt-stated)        06/06/2023  Assessment: Mrs. Nash enjoys tending to her flower gardens and plants. She enjoys going to the [facility] and doing Tai Chi. Mrs. Dobberstein endorses staying busy doing house work and etc,  and rarely takes time to rest. Reports lower extremity swelling from vascular/circulatory condition. Wears compression stockings. Reports pain 2 out of 10. 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 Reports her doctor has advised her to elevate lower extremities for 15 minutes a day.   Interventions: Encouraged Mrs. Filion to take rest breaks throughout the day and to elevate her legs during rest breaks. Encouraged Mrs. Lemarr to continue hand stretches/exercises to aide in hand pain and discomfort from trigger finger and arthritis.  Commended [name] in staying active and for doing activities and exercises she enjoys. Provided chronic conditions booklet, calendar, and writer's contact information.  Plan: Scheduled next home visit for May 15th at 10 am.   CLIENT/RN ACTION PLAN - PAIN  Registered Nurse:  Nolberto Batty Date: 06/06/23  Client Name: Josph Nimrod Client ID:    Target Area:  PAIN   Why Problem May Occur: Lifting heavy things, standing too long on feet, and going up and down the stairs.     Target Goal: Decrease pain levels and incorporate rest intervals during the day over the next 160 days.    STRATEGIES Coping Strategies Ideas:  Heat Reviewed 06/06/23 Reviewed 07/12/23 Use heating pad or warm towel on painful area no more than 20 minutes at a time. Don't sleep with a heating pad on - it could burn your skin   Ice Reviewed  06/06/23 Reviewed 07/12/23 Use ice pack or frozen bag of vegetables on painful area.   Leave cold pack on for less than 20 minutes. Ice can burn your skin.  Don't leave ice on longer than 20 minutes.   Activity and Exercise Reviewed 06/06/23 Reviewed 07/12/23 Joints get stiff when not in use Aging Gracefully Exercises Walking (inside or outside) [employer]:  cooking, cleaning, Building surveyor   Listen to Music Reviewed 06/06/23 Reviewed 07/12/23 Listening to music can decrease pain. Turn off the TV and turn on the radio.   Prayer/Meditation Reviewed 06/06/23 Reviewed 07/12/23 Prayer and meditation can decrease pain   Other   Acetaminophen /Tylenol  (same medication) Reviewed 06/06/23 Reviewed 07/12/23 DO NOT take more Acetaminophen  than below because it can be bad for your liver. 500 mg. tablets:  2 tablets every 8 hours, as needed for pain.  Do not take more than 6, (500 mg) tablets every day. 300 mg. tablets:  2 tablets every 6 hours, as needed for pain.  Do not take more than 8 (325 mg) tablets every day. Look for Acetaminophen /Tylenol  in other medicines you buy over the counter.   Still in Pain Reviewed 06/06/23 Reviewed 07/12/23 There ae a lot of different kinds of pain medicines:  creams, patches and supplements. Ask your Healthcare Provider about other pain medications.   Stop Smoking N/A Smoking can make arthritis worse.   Stop Pain Reviewed 06/06/23 Reviewed 07/12/23 Before it gets bad.  Once in pain It is harder to get rid of. Begin pain relief while you have mild pain.   Other   Other    ;  PRACTICE It is important to practice the strategies so we can determine if they will be effective in helping to reach the goal.    Follow these specific recommendations:        If strategy does not work the first time, try it again.  We may make some changes over the next few sessions.    We may make some changes over the next few sessions, based on how they work.     Nolberto Batty, MSN, RN, BSN [facility], Healthy Communities RN Case Manager for Aging Gracefully Direct Dial: [phone]     07/12/23  Assessment: Mrs. Ansell reports pain 2 out of 10. Reports alternating hot/ice therapy for pain. Endorses performing daily stretches. Reports having seasonal allergies and sinus issues. Not feeling that well today. Denies, SOB, chest pain, fever. Reports slight cough.   Interventions: Demonstrated home exercises. Mrs. Montejano performed return demonstration. Encouraged Mrs.Peterka to contact PCP if she doesn't feel any better over the next few days.   Plan: Scheduled next home visit for June 24th at 10 am.   Nolberto Batty, MSN, RN, BSN [place]  [facility], Healthy Communities RN Case Manager for Aging Gracefully Direct Dial: [phone]                                                              Our next appointment is on June 24th at 10 am.  If you are experiencing a Mental Health or Behavioral Health Crisis or need someone to talk to, please call the Suicide and Crisis Lifeline: 988 call the [place]  National Suicide Prevention Lifeline: 314-117-5950 or TTY: (530) 781-9966 TTY 850-355-3412) to talk to a trained counselor call 1-800-273-TALK (toll free, 24 hour hotline) go to [facility] [address] [place] [phone]) call 911   The patient verbalized understanding of instructions, educational materials, and care plan provided today and agreed to receive a mailed copy of patient instructions, educational materials, and care plan.   Nolberto Batty, MSN, RN, BSN [place]  [facility], Healthy Communities RN Case Manager for Aging Gracefully Direct Dial: [phone]

## 2023-07-12 NOTE — Patient Outreach (Signed)
 Aging Gracefully Program  RN Visit  07/12/2023  NABA BUONOCORE 12/11/1950 [id number]  Visit:  RN Visit Number: 2- Second Visit  RN TIME CALCULATION: Start TIme:  RN Start Time Calculation: 1000 End Time:  RN Stop Time Calculation: 1100 Total Minutes:  RN Time Calculation: 60  Readiness To Change Score:  Readiness to Change Score: 10  Universal RN Interventions: Calendar Distribution: No Exercise Review: Yes Medications: Yes Medication Changes: No Mood: Yes Pain: Yes PCP Advocacy/Support: No Fall Prevention: Yes Incontinence: Yes Clinician View Of Client Situation: Arrived for home visit. Joint visit with both Mrs. Jenson and spouse. Home, clean, neat, and without clutter. Mrs. Mclaren ambulates independently without assistive device. Client View Of His/Her Situation: Mrs. Easton reports having allergy/sinus issues currently. Not feeling the best.  Healthcare Provider Communication: Did Surveyor, mining With [employer] Provider?: No Healthcare Provider Response According to RN: N/A According to Client, Did PCP Report Communication With An Aging Gracefully RN?: No Healthcare Provider Response According To Client: N/A  Clinician View of Client Situation: Clinician View Of Client Situation: Arrived for home visit. Joint visit with both Mrs. Brandenberger and spouse. Home, clean, neat, and without clutter. Mrs. Reiswig ambulates independently without assistive device. Client's View of His/Her Situation: Client View Of His/Her Situation: Mrs. Stavig reports having allergy/sinus issues currently. Not feeling the best.  Medication Assessment: Reviewed.    OT Update: Pending [facility] assessments and contracts  Session Summary: Mrs. Carley in good spirits despite not feeling well today from seasonal allergies and sinus trouble.    Goals Addressed               This Visit's Progress     AG RN (pt-stated)        06/06/2023  Assessment: Mrs.  Cripps enjoys tending to her flower gardens and plants. She enjoys going to the [facility] and doing Tai Chi. Mrs. Frieden endorses staying busy doing house work and etc,  and rarely takes time to rest. Reports lower extremity swelling from vascular/circulatory condition. Wears compression stockings. Reports pain 2 out of 10. 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 Reports her doctor has advised her to elevate lower extremities for 15 minutes a day.   Interventions: Encouraged Mrs. Trager to take rest breaks throughout the day and to elevate her legs during rest breaks. Encouraged Mrs. Lorton to continue hand stretches/exercises to aide in hand pain and discomfort from trigger finger and arthritis.  Commended [name] in staying active and for doing activities and exercises she enjoys. Provided chronic conditions booklet, calendar, and writer's contact information.  Plan: Scheduled next home visit for May 15th at 10 am.   CLIENT/RN ACTION PLAN - PAIN  Registered Nurse:  Nolberto Batty Date: 06/06/23  Client Name: Josph Nimrod Client ID:    Target Area:  PAIN   Why Problem May Occur: Lifting heavy things, standing too long on feet, and going up and down the stairs.     Target Goal: Decrease pain levels and incorporate rest intervals during the day over the next 160 days.    STRATEGIES Coping Strategies Ideas:  Heat Reviewed 06/06/23 Reviewed 07/12/23 Use heating pad or warm towel on painful area no more than 20 minutes at a time. Don't sleep with a heating pad on - it could burn your skin   Ice Reviewed 06/06/23 Reviewed 07/12/23 Use ice pack or frozen bag of vegetables on painful area.   Leave cold pack on for less than 20 minutes. Ice can  burn your skin.  Don't leave ice on longer than 20 minutes.   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 Turn off the TV and turn on the radio.   Prayer/Meditation Reviewed 06/06/23 Reviewed 07/12/23 Prayer and meditation can decrease pain   Other   Acetaminophen /Tylenol  (same medication) Reviewed 06/06/23 Reviewed 07/12/23 DO NOT take more Acetaminophen  than below because it can be bad for your liver. 500 mg. tablets:  2 tablets every 8 hours, as needed for pain.  Do not take more than 6, (500 mg) tablets every day. 300 mg. tablets:  2 tablets every 6 hours, as needed for pain.  Do not take more than 8 (325 mg) tablets every day. Look for Acetaminophen /Tylenol  in other medicines you buy over the counter.   Still in Pain Reviewed 06/06/23 Reviewed 07/12/23 There ae a lot of different kinds of pain medicines:  creams, patches and supplements. Ask your Healthcare Provider about other pain medications.   Stop Smoking N/A Smoking can make arthritis worse.   Stop Pain Reviewed 06/06/23 Reviewed 07/12/23 Before it gets bad. Once in pain It is harder to get rid of. Begin pain relief while you have mild pain.   Other   Other    ;  PRACTICE It is important to practice the strategies so we can determine if they will be effective in helping to reach the goal.    Follow these specific recommendations:        If strategy does not work the first time, try it again.  We may make some changes over the next few sessions.    We may make some changes over the next few sessions, based on how they work.    Nolberto Batty, MSN, RN, BSN [facility], Healthy Communities RN Case Manager for Aging Gracefully Direct Dial: [phone]     07/12/23  Assessment: Mrs. Marx reports pain 2 out of 10. Reports alternating hot/ice therapy for pain. Endorses performing daily stretches. Reports having seasonal allergies and sinus  issues. Not feeling that well today. Denies, SOB, chest pain, fever. Reports slight cough.   Interventions: Demonstrated home exercises. Mrs. Bellew performed return demonstration. Encouraged Mrs.Crafts to contact PCP if she doesn't feel any better over the next few days.   Plan: Scheduled next home visit for June 24th at 10 am.   Nolberto Batty, MSN, RN, BSN [place]  [facility], Healthy Communities RN Case Manager for Aging Gracefully Direct Dial: [phone]                                                           Nolberto Batty, MSN, RN, BSN [place]  [facility], Healthy Communities RN Case Manager for Aging Gracefully Direct Dial: [phone]

## 2023-07-20 ENCOUNTER — Other Ambulatory Visit: Admitting: Occupational Therapy

## 2023-07-20 NOTE — Patient Instructions (Signed)
 Name: Kelly Watson       Date: 07/20/2023   OT ACTION PLAN   Target Problem Area:   Difficulty putting on compression socks    Why Problem May Occur:     Decreased flexibility of legs Decreased active range of motion right shoulder Arthritis in hands  Target Goal(s):   Increased ease with putting on compression socks    STRATEGIES    Saving Your Energy DO:  Sit down to do tasks  Use appropriate adaptive equipment (use compression sock donner)  Keep all items you'll need within easy reach Gather all dressing items before you sit down to start to get dressed   PRACTICE   Based on what we have talked about, you are willing to try:   To use the compression sock donner for one week.    We may make some additional changes based on how this works.    Fletcher Humble, OTR/L         07/20/2023  Occupational Therapist             Date

## 2023-07-20 NOTE — Patient Outreach (Signed)
 Aging Gracefully Program  OT Follow-Up Visit  07/20/2023  Kelly Watson 02-13-1951 [id number]  Visit:  3- Third Visit  Start Time:  1505 End Time:  1530 Total Minutes:  25  Readiness to Change Score :  Readiness to Change Score: 10  Durable Medical Equipment: Adaptive Equipment: Other (compresson sock donner) Adaptive Equipment Distribution Date: 07/20/23    Goals:   Goals Addressed               This Visit's Progress     COMPLETED: Patient Stated (pt-stated)        Easier putting on and taking off compression stockings. Different techniques and/or devices may help. In Process: I had her try a compression sock donner and it did work for her but with a struggle.Will order a different one for her to see if will work better (one that is more slanted and has higher handles.  07/20/2023---A different compression sock donner was tried today and work for Kelly Watson. Goal MET  Name: Kelly Watson       Date: 07/20/2023   OT ACTION PLAN   Target Problem Area:   Difficulty putting on compression socks    Why Problem May Occur:     Decreased flexibility of legs Decreased active range of motion right shoulder Arthritis in hands  Target Goal(s):   Increased ease with putting on compression socks    STRATEGIES    Saving Your Energy DO:  Sit down to do tasks  Use appropriate adaptive equipment (use compression sock donner)  Keep all items you'll need within easy reach Gather all dressing items before you sit down to start to get dressed   PRACTICE   Based on what we have talked about, you are willing to try:   To use the compression sock donner for one week.    We may make some additional changes based on how this works.    Fletcher Humble, OTR/L         07/20/2023  Occupational Therapist             Date        Post Clinical Reasoning: Client Action (Goal) Three Interventions: Provided [name] with a different compression sock donner and  this one worked better. Handles longer and foot piece at an angle instead of straight up and down Did Client Try?: Yes Targeted Problem Area Status: A Lot Better Clinician View Of Client Situation:: THe compression sock donner today worked better than the one last time I saw her. This will have help to keep her from struggling to get her compression socks on. Client View Of His/Her Situation:: Happy about her compression sock donner and willing to keep trying it to get used to it Next Visit Plan:: Hands free hair dryer  Caryl Clas, OT Aging Gracefully [phone]

## 2023-08-03 ENCOUNTER — Other Ambulatory Visit: Payer: Self-pay | Admitting: Occupational Therapy

## 2023-08-03 NOTE — Patient Instructions (Signed)
  Name: Kelly Watson              Date: 08/03/2023  OT Action Plan: Generic  Target Problem Area:  Drying and styling hair at same time   Why Problem May Occur:    Decreased range of motion one shoulder   Target Goal(s):  Independently being able to dry and style hair at same time   STRATEGIES   Saving your Energy DO:  May consider sitting to do hair    Change your home to make it safe for you    DO:  Use hands free hair dryer holder so you don't have to try and "juggle" hair drying and trying to style at the same time; thus making your arm hurt more    PRACTICE  Based on what we have talked about, you are willing to try:  To use the hands-free hair dryer over the next 2 weeks to see how it works for you.   If an idea does not work the first time, try it again.  We may make some changes over the next few sessions, based on how they work.   Fletcher Humble, OTR/L      08/03/2023

## 2023-08-03 NOTE — Patient Outreach (Signed)
 Aging Gracefully Program  OT Follow-Up Visit  08/03/2023  JOSIAH WOJTASZEK 1950-09-27 [id number]  Visit:  4- Fourth Visit  Start Time:  1600 End Time:  1630 Total Minutes:  30  Readiness to Change Score :  Readiness to Change Score: 10  Durable Medical Equipment: Adaptive Equipment: Other (hands free hair dryer) Adaptive Equipment Distribution Date: 08/03/23  Goals:   Goals Addressed             This Visit's Progress    Patient Stated       Ease of drying hair-- a hands free hair dryer holder may help with this. In process  Name: Temple Ewart              Date: 08/03/2023  OT Action Plan: Generic  Target Problem Area:  Drying and styling hair at same time   Why Problem May Occur:    Decreased range of motion one shoulder   Target Goal(s):  Independently being able to dry and style hair at same time   STRATEGIES   Saving your Energy DO:  May consider sitting to do hair    Change your home to make it safe for you    DO:  Use hands free hair dryer holder so you don't have to try and "juggle" hair drying and trying to style at the same time; thus making your arm hurt more    PRACTICE  Based on what we have talked about, you are willing to try:  To use the hands-free hair dryer over the next 2 weeks to see how it works for you.   If an idea does not work the first time, try it again.  We may make some changes over the next few sessions, based on how they work.   Fletcher Humble, OTR/L      08/03/2023        Post Clinical Reasoning: Client Action (Goal) Two Interventions: Inadvertantly put client's hair drying goal as one of her husband's goals. Goal for Mrs. Brazeau to be able able to do her hair without having to hold the hairdryer since she has one shoulder that not the best. We had 2 holders for her to choose from. We tried setting up both and decided on one of them that seemed to work the best. Client will try it out the next time she  washes her hair. Goal now added to Mrs. Rex. Did Client Try?:  (Got the holder setup where it will hold/support her hair dryer-she will actually try it the next time she washes her hair,) Targeted Problem Area Status:  (TBD) Clinician View Of Client Situation:: Mrs. Essie Hefty should be able to use the hands free hair dryer holder. Client View Of His/Her Situation:: She knows the hands free hair dryer holder will take some time to get use to--but she is willing to give it a try and keep trying to see if it does indeed help her. Next Visit Plan:: Awaiting Modifications  Caryl Clas, OT Aging Gracefully [phone]

## 2023-08-21 ENCOUNTER — Other Ambulatory Visit: Payer: Self-pay | Admitting: *Deleted

## 2023-08-21 ENCOUNTER — Encounter: Payer: Self-pay | Admitting: *Deleted

## 2023-08-21 NOTE — Patient Instructions (Signed)
 Visit Information  Thank you for taking time to visit with me today. Please don't hesitate to contact me if I can be of assistance to you before our next scheduled home appointment.  Following are the goals we discussed today:   Goals Addressed               This Visit's Progress     AG RN (pt-stated)        06/06/2023  Assessment: Mrs. Mishra enjoys tending to her flower gardens and plants. She enjoys going to the [facility] and doing Tai Chi. Mrs. Chai endorses staying busy doing house work and etc,  and rarely takes time to rest. Reports lower extremity swelling from vascular/circulatory condition. Wears compression stockings. Reports pain 2 out of 10. 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 Reports her doctor has advised her to elevate lower extremities for 15 minutes a day.   Interventions: Encouraged Mrs. Hew to take rest breaks throughout the day and to elevate her legs during rest breaks. Encouraged Mrs. Pelley to continue hand stretches/exercises to aide in hand pain and discomfort from trigger finger and arthritis.  Commended [name] in staying active and for doing activities and exercises she enjoys. Provided chronic conditions booklet, calendar, and writer's contact information.  Plan: Scheduled next home visit for May 15th at 10 am.   CLIENT/RN ACTION PLAN - PAIN  Registered Nurse:  Pablo Hurst Date: 06/06/23  Client Name: Meade Ronie Client ID:    Target Area:  PAIN   Why Problem May Occur: Lifting heavy things, standing too long on feet, and going up and down the stairs.     Target Goal: Decrease pain levels and incorporate rest intervals during the day over the next 160 days.    STRATEGIES Coping Strategies Ideas:  Heat Reviewed 06/06/23 Reviewed 07/12/23 Reviewed 08/21/23 Use heating pad or warm towel on painful area no more than 20 minutes at a time. Don't sleep with a heating pad on - it could burn your skin    Ice Reviewed 06/06/23 Reviewed 07/12/23 Reviewed 08/21/23 Use ice pack or frozen bag of vegetables on painful area.   Leave cold pack on for less than 20 minutes. Ice can burn your skin.  Don't leave ice on longer than 20 minutes.   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 Turn off the TV and turn on the radio.   Prayer/Meditation Reviewed 06/06/23 Reviewed 07/12/23 Reviewed 08/21/23 Prayer and meditation can decrease pain   Other   Acetaminophen /Tylenol  (same medication) Reviewed 06/06/23 Reviewed 07/12/23 Reviewed 08/21/23 DO NOT take more Acetaminophen  than below because it can be bad for your liver. 500 mg. tablets:  2 tablets every 8 hours, as needed for pain.  Do not take more than 6, (500 mg) tablets every day. 300 mg. tablets:  2 tablets every 6 hours, as needed for pain.  Do not take more than 8 (325 mg) tablets every day. Look for Acetaminophen /Tylenol  in other medicines you buy over the counter.   Still in Pain Reviewed 06/06/23 Reviewed 07/12/23 Reviewed 08/21/23 There ae a lot of different kinds of pain medicines:  creams, patches and supplements. Ask your Healthcare Provider about other pain medications.   Stop Smoking N/A Smoking can make  arthritis worse.   Stop Pain Reviewed 06/06/23 Reviewed 07/12/23 Reviewed 08/21/23 Before it gets bad. Once in pain It is harder to get rid of. Begin pain relief while you have mild pain.   Other   Other    ;  PRACTICE It is important to practice the strategies so we can determine if they will be effective in helping to reach the goal.    Follow these specific recommendations:        If strategy does not work the first time, try it  again.  We may make some changes over the next few sessions.    We may make some changes over the next few sessions, based on how they work.    Pablo Hurst, MSN, RN, BSN [facility], Healthy Communities RN Case Manager for Aging Gracefully Direct Dial: [phone]     07/12/23  Assessment: Mrs. Gilpin reports pain 2 out of 10. Reports alternating hot/ice therapy for pain. Endorses performing daily stretches. Reports having seasonal allergies and sinus issues. Not feeling that well today. Denies, SOB, chest pain, fever. Reports slight cough.   Interventions: Demonstrated home exercises. Mrs. Pfohl performed return demonstration. Encouraged Mrs.Tallo to contact PCP if she doesn't feel any better over the next few days.   Plan: Scheduled next home visit for June 24th at 10 am.   Pablo Hurst, MSN, RN, BSN [place]  [facility], Healthy Communities RN Case Manager for Aging Gracefully Direct Dial: [phone]    08/21/23  Assessment: Mrs Scaff is doing well. States she is feeling better since last AG RN visit. Reports having issues with sinuses during last home visit in May. States she is also relieved graduation season is over for her granddaughter  States she was very busy during that time. Reports enjoying gardening and staying active. States she has less pain when she remains active. However, tends to be very tired by the end of the day.  Reports pain 3 out of 10.   Interventions: Encouraged Mrs. Leng to take brief frequent breaks during the day. Encouraged setting alarms on her phone for rest reminders thought the day. Encouraged Mrs. Burpee to elevate her legs frequently during the day. Encouraged Mrs. Parmer to limit time outdoors during the hottest periods of the day. Encouraged Mrs. Plamondon to stay hydrated.   Plan: Scheduled next joint home visit on July 31 at 10 am.   Pablo Hurst, MSN, RN, BSN [facility]  [facility], Healthy Communities RN Case Manager for Aging Gracefully Direct Dial: [phone]                                                                                         Our next appointment is on July 31 at 10 am.  If you are experiencing a Mental Health or Behavioral Health Crisis or need someone to talk to, please call the Suicide and Crisis Lifeline: 988 call the [place]  National Suicide Prevention Lifeline: 2145985057 or TTY: 5030734663 TTY 301-287-1878) to talk to a trained counselor call 1-800-273-TALK (toll free, 24 hour hotline) go to [facility] [address] [place] [phone]) call 911   The patient verbalized understanding of instructions, educational materials, and care plan provided today and agreed to receive a mailed copy of patient instructions, educational materials, and care plan.   Pablo Hurst, MSN, RN, BSN [place]  [facility], Healthy Communities RN Case Manager for Aging Gracefully Direct Dial: [phone]

## 2023-08-21 NOTE — Patient Outreach (Signed)
 Aging Gracefully Program  RN Visit  08/21/2023  Kelly Watson Jul 03, 1950 [id number]  Visit:  RN Visit Number: 3- Third Visit  RN TIME CALCULATION: Start TIme:  RN Start Time Calculation: 1000 End Time:  RN Stop Time Calculation: 1135 Total Minutes:  RN Time Calculation: 95  Readiness To Change Score:  Readiness to Change Score: 10  Universal RN Interventions: Calendar Distribution: No Exercise Review: Yes Medications: Yes Medication Changes: No Mood: Yes Pain: Yes PCP Advocacy/Support: No Fall Prevention: Yes Incontinence: Yes Clinician View Of Client Situation: Arrived for joint home visit for Kelly Watson and husband. Kelly Watson answered door. Ambulates independently without assistive device. Home clean, neat, and without clutter thoughout the home. Thrving plants noted throughout the home. Client View Of His/Her Situation: Kelly Watson reports a lot of good things have been going on. She is very pleased with the new roof completed by [facility]. Mrs. Renbarger endorses relief after all of the busyness for her granddaughter's recent high school graduation.  Healthcare Provider Communication: Did Surveyor, mining With [employer] Provider?: No Healthcare Provider Response According to RN: N/A According to Client, Did PCP Report Communication With An Aging Gracefully RN?: No Healthcare Provider Response According To Client: N/A  Clinician View of Client Situation: Clinician View Of Client Situation: Arrived for joint home visit for Kelly Watson and husband. Kelly Watson answered door. Ambulates independently without assistive device. Home clean, neat, and without clutter thoughout the home. Thrving plants noted throughout the home. Client's View of His/Her Situation: Client View Of His/Her Situation: Kelly Watson reports a lot of good things have been going on. She is very pleased with the new roof completed by [facility]. Kelly Watson endorses relief after  all of the busyness for her granddaughter's recent high school graduation.  Medication Assessment: Reviewed    OT Update: Pending CHS completion  Session Summary: Kelly Watson is pleasant and engaging during home visit. She is doing well overall. Reports being extremely pleased with the new roof. Kelly Watson enjoys staying active and busy.    Goals Addressed               This Visit's Progress     AG RN (pt-stated)        06/06/2023  Assessment: Kelly Watson enjoys tending to her flower gardens and plants. She enjoys going to the [facility] and doing Tai Chi. Kelly Watson endorses staying busy doing house work and etc,  and rarely takes time to rest. Reports lower extremity swelling from vascular/circulatory condition. Wears compression stockings. Reports pain 2 out of 10. 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 Reports her doctor has advised her to elevate lower extremities for 15 minutes a day.   Interventions: Encouraged Kelly Watson to take rest breaks throughout the day and to elevate her legs during rest breaks. Encouraged Kelly Watson to continue hand stretches/exercises to aide in hand pain and discomfort from trigger finger and arthritis.  Commended Kelly Watson in staying active and for doing activities and exercises she enjoys. Provided chronic conditions booklet, calendar, and writer's contact information.  Plan: Scheduled next home visit for May 15th at 10 am.   CLIENT/RN ACTION PLAN - PAIN  Registered Nurse:  Pablo Hurst Date: 06/06/23  Client Name: Kelly Watson Client ID:    Target Area:  PAIN   Why Problem May Occur: Lifting heavy things, standing too long on feet, and going up and down the stairs.     Target Goal: Decrease pain levels and  incorporate rest intervals during the day over the next 160 days.    STRATEGIES Coping Strategies Ideas:  Heat Reviewed 06/06/23 Reviewed 07/12/23 Reviewed 08/21/23 Use heating pad or warm  towel on painful area no more than 20 minutes at a time. Don't sleep with a heating pad on - it could burn your skin   Ice Reviewed 06/06/23 Reviewed 07/12/23 Reviewed 08/21/23 Use ice pack or frozen bag of vegetables on painful area.   Leave cold pack on for less than 20 minutes. Ice can burn your skin.  Don't leave ice on longer than 20 minutes.   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 Turn off the TV and turn on the radio.   Prayer/Meditation Reviewed 06/06/23 Reviewed 07/12/23 Reviewed 08/21/23 Prayer and meditation can decrease pain   Other   Acetaminophen /Tylenol  (same medication) Reviewed 06/06/23 Reviewed 07/12/23 Reviewed 08/21/23 DO NOT take more Acetaminophen  than below because it can be bad for your liver. 500 mg. tablets:  2 tablets every 8 hours, as needed for pain.  Do not take more than 6, (500 mg) tablets every day. 300 mg. tablets:  2 tablets every 6 hours, as needed for pain.  Do not take more than 8 (325 mg) tablets every day. Look for Acetaminophen /Tylenol  in other medicines you buy over the counter.   Still in Pain Reviewed 06/06/23 Reviewed 07/12/23 Reviewed 08/21/23 There ae a lot of different kinds of pain medicines:  creams, patches and supplements. Ask your Healthcare Provider about other pain medications.   Stop Smoking N/A Smoking can make arthritis worse.   Stop Pain Reviewed 06/06/23 Reviewed 07/12/23 Reviewed 08/21/23 Before it gets bad. Once in pain It is harder to get rid of. Begin pain relief while you have mild pain.   Other   Other    ;  PRACTICE It is important to practice the strategies so we can determine if they will be effective in helping to  reach the goal.    Follow these specific recommendations:        If strategy does not work the first time, try it again.  We may make some changes over the next few sessions.    We may make some changes over the next few sessions, based on how they work.    Pablo Hurst, MSN, RN, BSN [facility], Healthy Communities RN Case Manager for Aging Gracefully Direct Dial: [phone]     07/12/23  Assessment: Mrs. Rosado reports pain 2 out of 10. Reports alternating hot/ice therapy for pain. Endorses performing daily stretches. Reports having seasonal allergies and sinus issues. Not feeling that well today. Denies, SOB, chest pain, fever. Reports slight cough.   Interventions: Demonstrated home exercises. Mrs. Pettigrew performed return demonstration. Encouraged KellyBusey to contact PCP if she doesn't feel any better over the next few days.   Plan: Scheduled next home visit for June 24th at 10 am.   Pablo Hurst, MSN, RN, BSN [place]  [facility], Healthy Communities RN Case Manager for Aging Gracefully Direct Dial: [phone]    08/21/23  Assessment: Mrs Harden is doing well. States she is feeling better since last AG RN visit. Reports having issues with sinuses during last home visit in May. States she  is also relieved graduation season is over for her granddaughter  States she was very busy during that time. Reports enjoying gardening and staying active. States she has less pain when she remains active. However, tends to be very tired by the end of the day.  Reports pain 3 out of 10.   Interventions: Encouraged Mrs. Handley to take brief frequent breaks during the day. Encouraged setting alarms on her phone for rest reminders thought the day. Encouraged Mrs. Dorvil to elevate her legs frequently during the day. Encouraged Mrs. Valenza to limit time outdoors during the hottest periods of the day. Encouraged Mrs.  Uselton to stay hydrated.   Plan: Scheduled next joint home visit on July 31 at 10 am.   Pablo Hurst, MSN, RN, BSN [place]  [facility], Healthy Communities RN Case Manager for Aging Gracefully Direct Dial: [phone]

## 2023-08-28 DIAGNOSIS — H40033 Anatomical narrow angle, bilateral: Secondary | ICD-10-CM | POA: Diagnosis not present

## 2023-08-28 DIAGNOSIS — H2513 Age-related nuclear cataract, bilateral: Secondary | ICD-10-CM | POA: Diagnosis not present

## 2023-09-27 ENCOUNTER — Encounter: Payer: Self-pay | Admitting: *Deleted

## 2023-09-27 ENCOUNTER — Other Ambulatory Visit: Payer: Self-pay | Admitting: *Deleted

## 2023-09-27 NOTE — Patient Instructions (Signed)
 Visit Information  Thank you for taking time to visit with me today. Please don't hesitate to contact me if I can be of assistance to you before our next scheduled home appointment.  Following are the goals we discussed today:   Goals Addressed               This Visit's Progress     AG RN (pt-stated)        06/06/2023  Assessment: Kelly Watson enjoys tending to her flower gardens and plants. She enjoys going to the [facility] and doing Tai Chi. Kelly Watson endorses staying busy doing house work and etc,  and rarely takes time to rest. Reports lower extremity swelling from vascular/circulatory condition. Wears compression stockings. Reports pain 2 out of 10. 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 Reports her doctor has advised her to elevate lower extremities for 15 minutes a day.   Interventions: Encouraged Kelly Watson to take rest breaks throughout the day and to elevate her legs during rest breaks. Encouraged Kelly Watson to continue hand stretches/exercises to aide in hand pain and discomfort from trigger finger and arthritis.  Commended Kelly Watson in staying active and for doing activities and exercises she enjoys. Provided chronic conditions booklet, calendar, and writer's contact information.  Plan: Scheduled next home visit for May 15th at 10 am.   CLIENT/RN ACTION PLAN - PAIN  Registered Nurse:  Pablo Hurst Date: 06/06/23  Client Name: Kelly Watson Client ID:    Target Area:  PAIN   Why Problem May Occur: Lifting heavy things, standing too long on feet, and going up and down the stairs.     Target Goal: Decrease pain levels and incorporate rest intervals during the day over the next 160 days.    STRATEGIES Coping Strategies Ideas:  Heat Reviewed 06/06/23 Reviewed 07/12/23 Reviewed 08/21/23 Reviewed 09/27/23 Use heating pad or warm towel on painful area no more than 20 minutes at a time. Don't sleep with a heating pad on - it could burn your  skin   Ice Reviewed 06/06/23 Reviewed 07/12/23 Reviewed 08/21/23 Reviewed 09/27/23 Use ice pack or frozen bag of vegetables on painful area.   Leave cold pack on for less than 20 minutes. Ice can burn your skin.  Don't leave ice on longer than 20 minutes.   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 Turn off the TV and turn on the radio.   Prayer/Meditation Reviewed 06/06/23 Reviewed 07/12/23 Reviewed 08/21/23 Prayer and meditation can decrease pain   Other   Acetaminophen /Tylenol  (same medication) Reviewed 06/06/23 Reviewed 07/12/23 Reviewed 08/21/23 Reviewed 09/27/23 DO NOT take more Acetaminophen  than below because it can be bad for your liver. 500 mg. tablets:  2 tablets every 8 hours, as needed for pain.  Do not take more than 6, (500 mg) tablets every day. 300 mg. tablets:  2 tablets every 6 hours, as needed for pain.  Do not take more than 8 (325 mg) tablets every day. Look for Acetaminophen /Tylenol  in other medicines you buy over the counter.   Still in Pain Reviewed 06/06/23 Reviewed 07/12/23 Reviewed 08/21/23 Reviewed 09/27/23 There ae a lot of different kinds of pain medicines:  creams, patches and supplements. Ask your Healthcare Provider  about other pain medications.   Stop Smoking N/A Smoking can make arthritis worse.   Stop Pain Reviewed 06/06/23 Reviewed 07/12/23 Reviewed 08/21/23 Reviewed 09/27/23 Before it gets bad. Once in pain It is harder to get rid of. Begin pain relief while you have mild pain.   Other   Other    ;  PRACTICE It is important to practice the strategies so we can determine if they will be effective in helping to reach  the goal.    Follow these specific recommendations:        If strategy does not work the first time, try it again.  We may make some changes over the next few sessions.    We may make some changes over the next few sessions, based on how they work.    Pablo Hurst, MSN, RN, BSN [facility], Healthy Communities RN Case Manager for Aging Gracefully Direct Dial: [phone]     07/12/23  Assessment: Kelly Watson reports pain 2 out of 10. Reports alternating hot/ice therapy for pain. Endorses performing daily stretches. Reports having seasonal allergies and sinus issues. Not feeling that well today. Denies, SOB, chest pain, fever. Reports slight cough.   Interventions: Demonstrated home exercises. Kelly Watson performed return demonstration. Encouraged Kelly Watson to contact PCP if she doesn't feel any better over the next few days.   Plan: Scheduled next home visit for June 24th at 10 am.   Pablo Hurst, MSN, RN, BSN [place]  [facility], Healthy Communities RN Case Manager for Aging Gracefully Direct Dial: [phone]    08/21/23  Assessment: Mrs Watson is doing well. States she is feeling better since last AG RN visit. Reports having issues with sinuses during last home visit in May. States she is also relieved graduation season is over for her granddaughter  States she was very busy during that time. Reports enjoying gardening and staying active. States she has less pain when she remains active. However, tends to be very tired by the end of the day.  Reports pain 3 out of 10.   Interventions: Encouraged Kelly Watson to take brief frequent breaks during the day. Encouraged setting alarms on her phone for rest reminders thought the day. Encouraged Kelly Watson to elevate her legs frequently during the day. Encouraged Kelly Watson to limit time outdoors during the hottest periods of the day. Encouraged Kelly Watson  to stay hydrated.   Plan: Scheduled next joint home visit on July 31 at 10 am.   Pablo Hurst, MSN, RN, BSN [place]  [facility], Healthy Communities RN Case Manager for Aging Gracefully Direct Dial: [phone]      09/27/23  Assessment: Kelly Watson reports pain 4 out of 10. States creams with essential oil helps with pain in her knees at night. Reports being pretty busy with rearranging furniture for [facility] [employer] home repairs and modifications. States she has not elevated lower extremities as of late due to rearranged furniture and the like. Looking forward to getting home back tidy. 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 Reports feeling a little more drained from the heat and working in her gardens.  Interventions: Encouraged elevating lower extremities throughout the day during frequent rest breaks. Encourage increased water intake. Discussed pain relief strategies. Discussed this is writer's last home visit.  Plan: Will notify Aging Gracefully team of writer's last visit.  If you are experiencing a Mental Health or Behavioral Health Crisis or need someone to talk to, please call the Suicide and Crisis Lifeline: 988 call the [place]  National Suicide Prevention Lifeline: 220-035-9309 or TTY: (930) 226-9301 TTY 7721394585) to talk to a trained counselor call 1-800-273-TALK (toll free, 24 hour hotline) go to [facility] [address] [place] [phone]) call 911   The patient verbalized understanding of instructions, educational materials, and care plan provided today and agreed to receive a mailed copy of patient instructions, educational materials, and care plan.   Pablo Hurst, MSN, RN, BSN [place]  [facility], Healthy Communities RN Case  Manager for Aging Gracefully Direct Dial: [phone]

## 2023-09-27 NOTE — Patient Outreach (Signed)
 Aging Gracefully Program  RN Visit  09/27/2023  Kelly Watson 10-30-1950 [id number]  Visit:  RN Visit Number: 4- Fourth Visit  RN TIME CALCULATION: Start TIme:  RN Start Time Calculation: 1100 End Time:  RN Stop Time Calculation: 1330 Total Minutes:  RN Time Calculation: 150  Readiness To Change Score:  Readiness to Change Score: 10  Universal RN Interventions: Calendar Distribution: No Exercise Review: Yes Medications: Yes Medication Changes: No Mood: Yes Pain: Yes PCP Advocacy/Support: No Fall Prevention: Yes Incontinence: Yes Clinician View Of Client Situation: Arrived for joint home visit with Kelly Watson and spouse. CHS was in home working on home repairs and modifications. Home has some clutter due to rearrangements for home repairs. Kelly Watson ambulating without assistive device independebtly with a slight limp. Client View Of His/Her Situation: Kelly Watson is doing well over all. States the heat and working in [employer] garden has been a little draining as of late. Pleased with CHS work thus far and speaks very highly of AG program.  Child psychotherapist Communication: Did Surveyor, mining With [employer] Provider?: No Healthcare Provider Response According to RN: N/A According to Client, Did PCP Report Communication With An Aging Gracefully RN?: No Healthcare Provider Response According To Client: N/A  Diplomatic Services operational officer of Client Situation: Clinician View Of Client Situation: Arrived for joint home visit with Kelly Watson and spouse. CHS was in home working on home repairs and modifications. Home has some clutter due to rearrangements for home repairs. Kelly Watson ambulating without assistive device independebtly with a slight limp. Client's View of His/Her Situation: Client View Of His/Her Situation: Kelly Watson is doing well over all. States the heat and working in [employer] garden has been a little draining as of late. Pleased with CHS  work thus far and speaks very highly of AG program.  Medication Assessment: Reviewed    OT Update: Pending CHS completion  Session Summary: Kelly Watson expresses satisfaction and appreciation of the Aging Gracefully program and team members. She is looking forward to home modifications and repairs being completed.   Goals Addressed               This Visit's Progress     AG RN (pt-stated)        06/06/2023  Assessment: Kelly Watson enjoys tending to her flower gardens and plants. She enjoys going to the [employer] and doing Tai Chi. Kelly Watson endorses staying busy doing house work and etc,  and rarely takes time to rest. Reports lower extremity swelling from vascular/circulatory condition. Wears compression stockings. Reports pain 2 out of 10. 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 Reports her doctor has advised her to elevate lower extremities for 15 minutes a day.   Interventions: Encouraged Kelly Watson to take rest breaks throughout the day and to elevate her legs during rest breaks. Encouraged Kelly Watson to continue hand stretches/exercises to aide in hand pain and discomfort from trigger finger and arthritis.  Commended Kelly Watson in staying active and for doing activities and exercises she enjoys. Provided chronic conditions booklet, calendar, and writer's contact information.  Plan: Scheduled next home visit for May 15th at 10 am.   CLIENT/RN ACTION PLAN - PAIN  Registered Nurse:  Pablo Hurst Date: 06/06/23  Client Name: Kelly Watson Client ID:    Target Area:  PAIN   Why Problem May Occur: Lifting heavy things, standing too long on feet, and going up and down the stairs.     Target  Goal: Decrease pain levels and incorporate rest intervals during the day over the next 160 days.    STRATEGIES Coping Strategies Ideas:  Heat Reviewed 06/06/23 Reviewed 07/12/23 Reviewed 08/21/23 Reviewed 09/27/23 Use heating pad or warm towel on  painful area no more than 20 minutes at a time. Don't sleep with a heating pad on - it could burn your skin   Ice Reviewed 06/06/23 Reviewed 07/12/23 Reviewed 08/21/23 Reviewed 09/27/23 Use ice pack or frozen bag of vegetables on painful area.   Leave cold pack on for less than 20 minutes. Ice can burn your skin.  Don't leave ice on longer than 20 minutes.   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 Turn off the TV and turn on the radio.   Prayer/Meditation Reviewed 06/06/23 Reviewed 07/12/23 Reviewed 08/21/23 Prayer and meditation can decrease pain   Other   Acetaminophen /Tylenol  (same medication) Reviewed 06/06/23 Reviewed 07/12/23 Reviewed 08/21/23 Reviewed 09/27/23 DO NOT take more Acetaminophen  than below because it can be bad for your liver. 500 mg. tablets:  2 tablets every 8 hours, as needed for pain.  Do not take more than 6, (500 mg) tablets every day. 300 mg. tablets:  2 tablets every 6 hours, as needed for pain.  Do not take more than 8 (325 mg) tablets every day. Look for Acetaminophen /Tylenol  in other medicines you buy over the counter.   Still in Pain Reviewed 06/06/23 Reviewed 07/12/23 Reviewed 08/21/23 Reviewed 09/27/23 There ae a lot of different kinds of pain medicines:  creams, patches and supplements. Ask your Healthcare Provider about other pain medications.   Stop Smoking N/A Smoking can make arthritis worse.   Stop Pain Reviewed 06/06/23 Reviewed 07/12/23 Reviewed 08/21/23 Reviewed 09/27/23 Before it gets bad. Once in pain It is harder to get rid of. Begin pain relief while you have mild pain.   Other   Other    ;  PRACTICE It is  important to practice the strategies so we can determine if they will be effective in helping to reach the goal.    Follow these specific recommendations:        If strategy does not work the first time, try it again.  We may make some changes over the next few sessions.    We may make some changes over the next few sessions, based on how they work.    Pablo Hurst, MSN, RN, BSN [facility], Healthy Communities RN Case Manager for Aging Gracefully Direct Dial: [phone]     07/12/23  Assessment: Kelly Watson reports pain 2 out of 10. Reports alternating hot/ice therapy for pain. Endorses performing daily stretches. Reports having seasonal allergies and sinus issues. Not feeling that well today. Denies, SOB, chest pain, fever. Reports slight cough.   Interventions: Demonstrated home exercises. Kelly Watson performed return demonstration. Encouraged KellyHakeem to contact PCP if she doesn't feel any better over the next few days.   Plan: Scheduled next home visit for June 24th at 10 am.   Pablo Hurst, MSN, RN, BSN [place]  [facility] Healthy Communities RN Case Manager for Aging Gracefully Direct Dial: [phone]    08/21/23  Assessment: Mrs Gau is doing well. States she is feeling  better since last AG RN visit. Reports having issues with sinuses during last home visit in May. States she is also relieved graduation season is over for her granddaughter  States she was very busy during that time. Reports enjoying gardening and staying active. States she has less pain when she remains active. However, tends to be very tired by the end of the day.  Reports pain 3 out of 10.   Interventions: Encouraged Mrs. Julin to take brief frequent breaks during the day. Encouraged setting alarms on her phone for rest reminders thought the day. Encouraged Mrs. Mahone to elevate her legs frequently during the day. Encouraged  Mrs. Nesbit to limit time outdoors during the hottest periods of the day. Encouraged Mrs. Madeira to stay hydrated.   Plan: Scheduled next joint home visit on July 31 at 10 am.   Pablo Hurst, MSN, RN, BSN [place]  [facility], Healthy Communities RN Case Manager for Aging Gracefully Direct Dial: [phone]      09/27/23  Assessment: Mrs. Pippins reports pain 4 out of 10. States creams with essential oil helps with pain in her knees at night. Reports being pretty busy with rearranging furniture for [facility] home repairs and modifications. States she has not elevated lower extremities as of late due to rearranged furniture and the like. Looking forward to getting home back tidy. 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 Reports feeling a little more drained from the heat and working in her gardens.  Interventions: Encouraged elevating lower extremities throughout the day during frequent rest breaks. Encourage increased water intake. Discussed pain relief strategies. Discussed this is writer's last home visit.  Plan: Will notify Aging Gracefully team of writer's last visit.

## 2023-10-06 ENCOUNTER — Other Ambulatory Visit: Payer: Self-pay | Admitting: Occupational Therapy

## 2023-10-07 NOTE — Patient Outreach (Signed)
 Aging Gracefully Program  OT Follow-Up Visit  10/07/2023  [name] [birth year] [id number]  Visit:  5- Fifth Visit  Start Time:  1440 End Time:  1500 Total Minutes:  20  Readiness to Change Score :  Readiness to Change Score: 10   Goals:   Goals Addressed               This Visit's Progress     Patient Stated (pt-stated)        She would like to feel more safe in the hall bathroom. A tub to shower conversion or a tub cut out (if it works for her tub) would help with this; as well as a hand held shower with lower shower holder and 3 grab bars. Also a grab bar vertically in front of the toilet attached to the vanity. Also checking to see if the area in the hall bath on the floor can be fixed where it is uneven. Partially met--floor needs to be fixed and grab bar put up in master bath; the rest has been completed.  Name: Kelly Watson   Date: 10/06/2023   OT ACTION PLAN: Bathing   Target Problem Area:   Safety in the bathroom  Why Problem May Occur:    Decreased balance  Decreased movement  Pain   Target Goal(s):   Safety with showering and toileting    STRATEGIES   Saving Your Energy DO:  Use a tub seat   Keep frequently used items within easy reach  While seated use handheld shower to get wet and rinse off as much as possible while seated       Modifying your home environment and making it safe DO:  Install grab bars in the shower and next to the toilet  Place a rubber mat in front of seat or non- skid treads  Make sure the bathroom is well-lit    Simplifying the way you set up tasks or daily routines DO:  Plan to bathe/shower before you're overly tired  Gather everything you will need before beginning    PRACTICE   Based on what we have talked about, you are willing to keep trying:   To use all the modifications in your knew remodeled bathroom as needed.     If an idea does not work the first time, try it again (and again). We may make  some changes the next session, based on how they work.      Donny Ditch, OTR/L        10/06/2023  Occupational Therapist                        Date        Post Clinical Reasoning: Client Action (Goal) One Interventions: Very happy with tub to walk in shower conversion and comfort height toilets Did Client Try?: Yes (she has been using it) Targeted Problem Area Status: A Lot Better Client View Of His/Her Situation:: SHe is very please with new walk in shower and comfort height toilets Next Visit Plan:: finish education and follow up on a couple of more areas of work  [place], [place] Aging [facility]fully [phone]

## 2023-10-07 NOTE — Patient Instructions (Signed)
 Name: Kelly Watson   Date: 10/06/2023   OT ACTION PLAN: Bathing   Target Problem Area:   Safety in the bathroom  Why Problem May Occur:    Decreased balance  Decreased movement  Pain   Target Goal(s):   Safety with showering and toileting    STRATEGIES   Saving Your Energy DO:  Use a tub seat   Keep frequently used items within easy reach  While seated use handheld shower to get wet and rinse off as much as possible while seated       Modifying your home environment and making it safe DO:  Install grab bars in the shower and next to the toilet  Place a rubber mat in front of seat or non- skid treads  Make sure the bathroom is well-lit    Simplifying the way you set up tasks or daily routines DO:  Plan to bathe/shower before you're overly tired  Gather everything you will need before beginning    PRACTICE   Based on what we have talked about, you are willing to keep trying:   To use all the modifications in your knew remodeled bathroom as needed.     If an idea does not work the first time, try it again (and again). We may make some changes the next session, based on how they work.      Donny Ditch, OTR/L        10/06/2023  Occupational Therapist                        Date

## 2023-10-22 DIAGNOSIS — M7062 Trochanteric bursitis, left hip: Secondary | ICD-10-CM | POA: Diagnosis not present

## 2023-10-24 ENCOUNTER — Other Ambulatory Visit: Payer: Self-pay | Admitting: Occupational Therapy

## 2023-11-01 IMAGING — MG MM DIGITAL SCREENING BILAT W/ TOMO AND CAD
6 of 10 series · 6 of 30 positions shown · non-contrast
Comparison: Previous exam(s).

CLINICAL DATA: Screening.

EXAM:
DIGITAL SCREENING BILATERAL MAMMOGRAM WITH TOMOSYNTHESIS AND CAD
TECHNIQUE: Bilateral screening digital craniocaudal and mediolateral oblique
mammograms were obtained. Bilateral screening digital breast
tomosynthesis was performed. The images were evaluated with
computer-aided detection.

[R CC synth-2D]
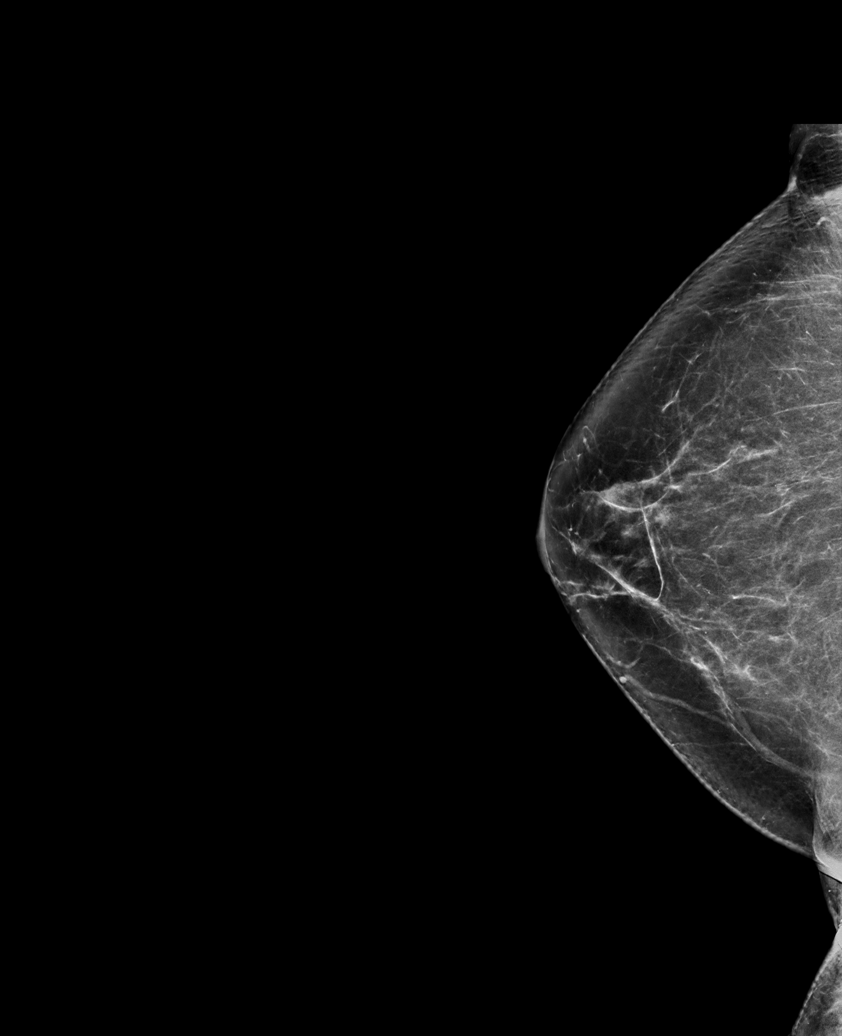

[L MLO synth-2D (1 of 2)]
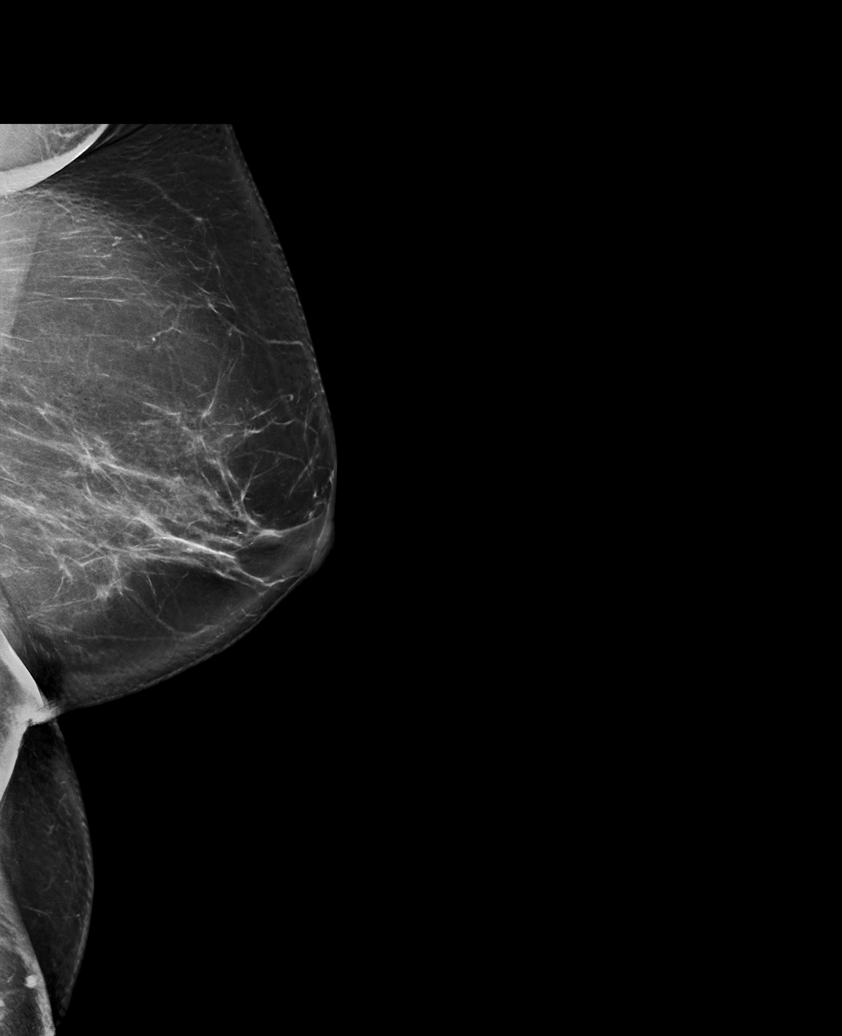

[L MLO synth-2D (2 of 2)]
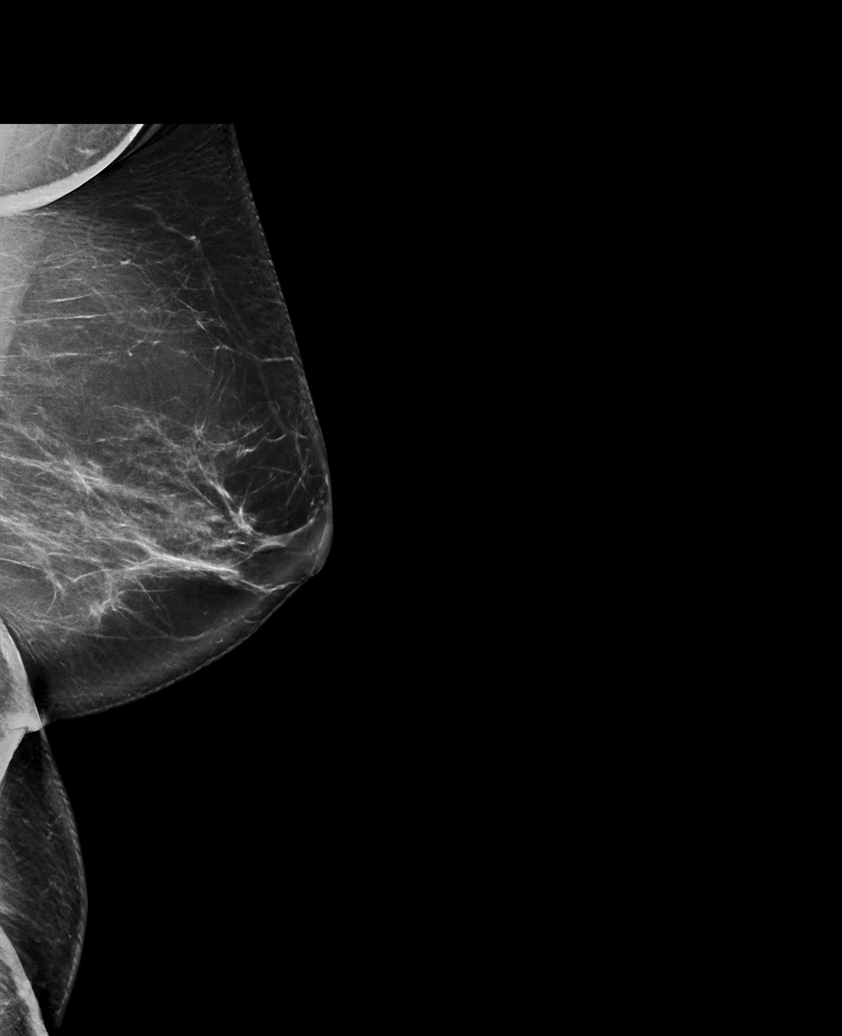

[R MLO synth-2D]
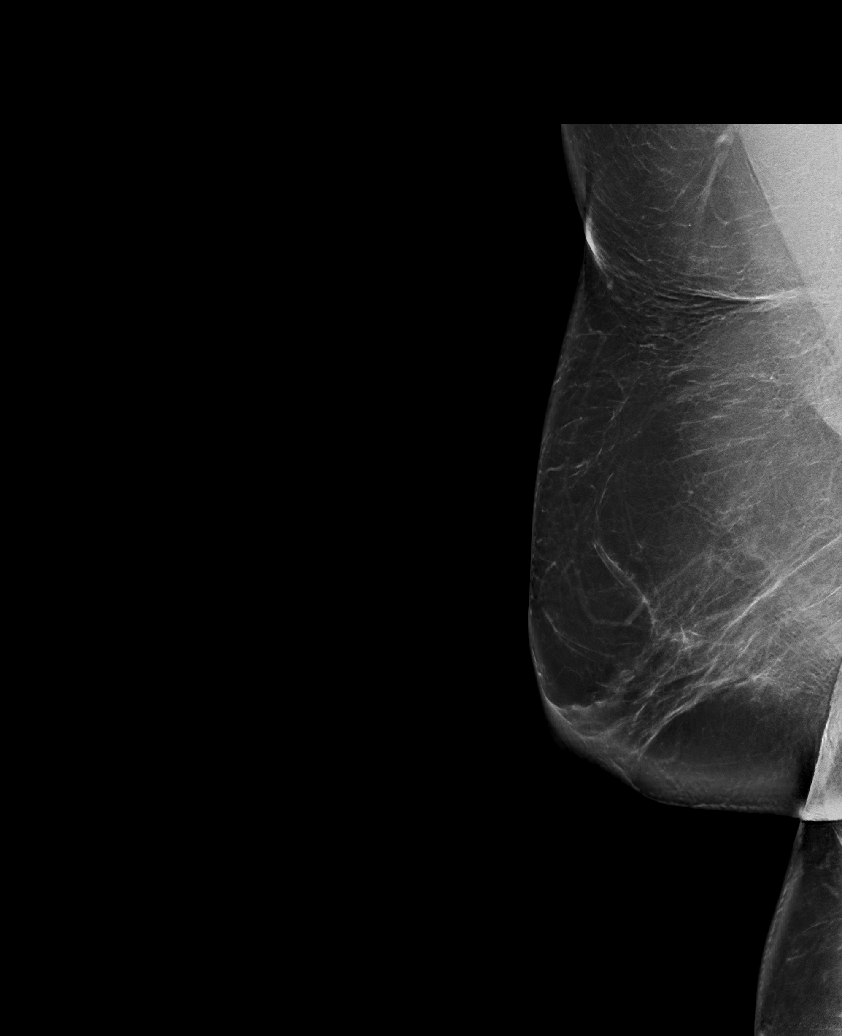

[L CC synth-2D]
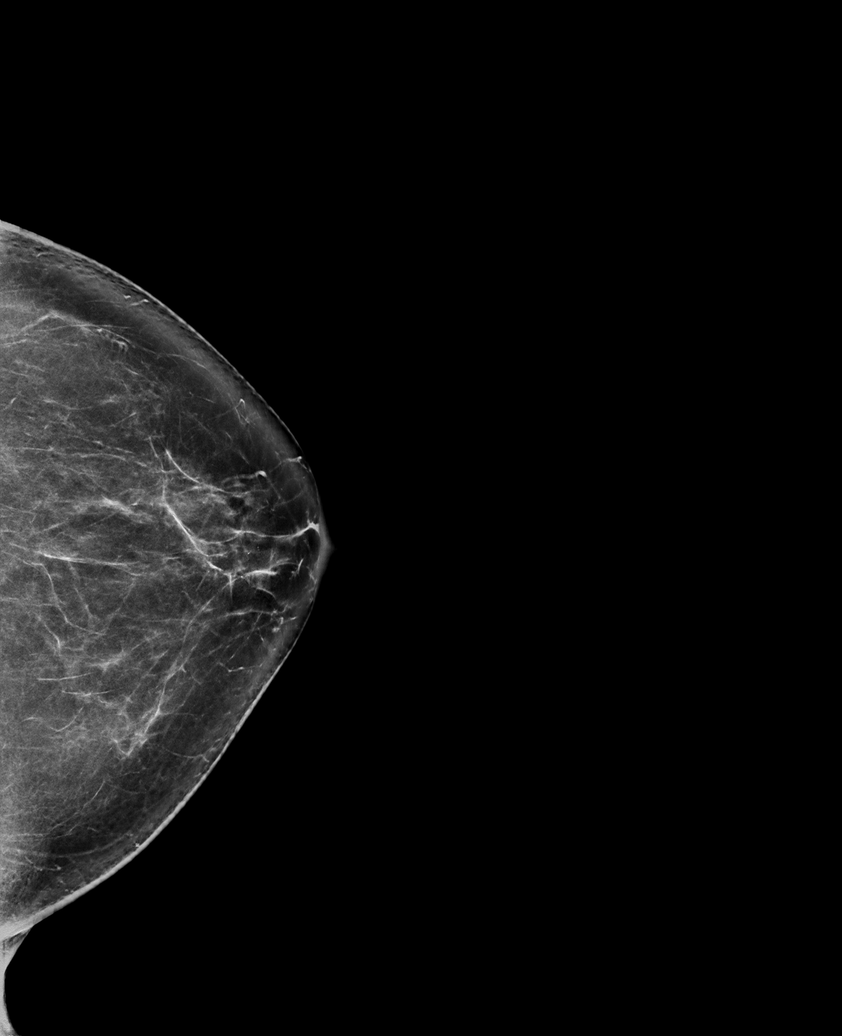

[L CC tomo · tomo slice 39/78.0]
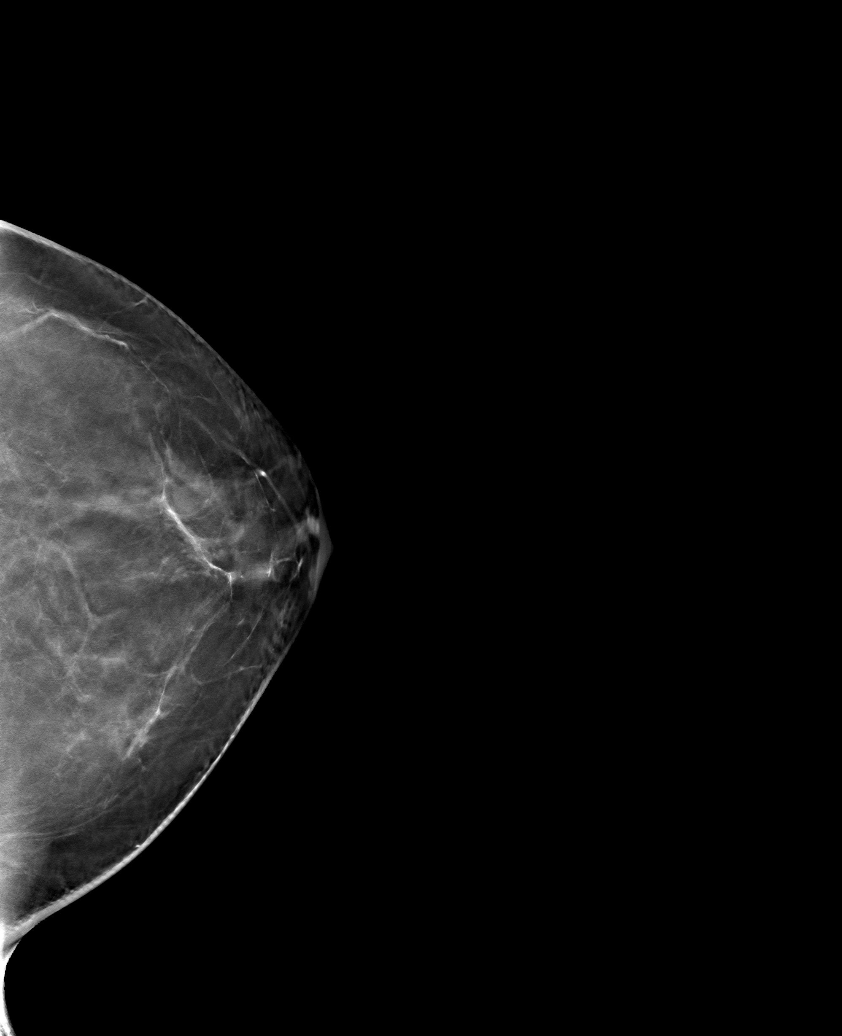

[6 of 30 positions shown; findings below may reference images not displayed]

ACR Breast Density Category b: There are scattered areas of
fibroglandular density.
FINDINGS: There are no findings suspicious for malignancy.
IMPRESSION: No mammographic evidence of malignancy. A result letter of this
screening mammogram will be mailed directly to the patient.

RECOMMENDATION:
Screening mammogram in one year. (Code:51-O-LD2)

BI-RADS CATEGORY  1: Negative.

## 2023-11-03 NOTE — Patient Outreach (Incomplete)
 Aging Gracefully Program  OT FINAL Visit  11/03/2023  BLENDA WISECUP 1950-03-26 [id number]  Visit:  6- Sixth Visit  Start Time:  1600 End Time:  1630 Total Minutes:  30  Readiness to Change:  Readiness to Change Score: 10  Patient Education: Education Provided: Yes Education Details: Tips for Aging at [employer]) Educated: Patient Comprehension: Verbalized Understanding  Goals:  Goals Addressed               This Visit's Progress     COMPLETED: Patient Stated        She would like to feel more safe with getting back and forth from car to house. A rail on both sides of their concrete ramp sidewalk will help with this. MET 8/27  OT ACTION PLAN: Functional Mobility   Target Problem Area:   Decreased safety up and down sidewalk from where car is parked and to the house    Why Problem May Occur:   Decreased balance  Pain  No hand rails  Target Goal(s):   Safety and independence up and down sidewalk from house to car    STRATEGIES   Modifying your home environment and making it safe     DO:  A handrail on each side of the sidewalk leading from car port to house     PRACTICE  Based on what we have talked about, you are willing to try:   To use at least one rail when going up and down sidewalk from car port to house.    If an idea does not work the first time, try it again (and again).  We may make some changes over the next few sessions, based on how they work.   Donny Ditch        10/24/2023 Occupational Therapist                        Date      COMPLETED: Patient Stated (pt-stated)        She would like to feel more safe in the hall bathroom. A tub to shower conversion or a tub cut out (if it works for her tub) would help with this; as well as a hand held shower with lower shower holder and 3 grab bars. Also a grab bar vertically in front of the toilet attached to the vanity. Also checking to see if the area in the hall bath on the floor  can be fixed where it is uneven. Partially met--floor needs to be fixed and grab bar put up in master bath; the rest has been completed. 8/27 MET  Name: Shawnika Pepin   Date: 10/06/2023   OT ACTION PLAN: Bathing   Target Problem Area:   Safety in the bathroom  Why Problem May Occur:    Decreased balance  Decreased movement  Pain   Target Goal(s):   Safety with showering and toileting    STRATEGIES   Saving Your Energy DO:  Use a tub seat   Keep frequently used items within easy reach  While seated use handheld shower to get wet and rinse off as much as possible while seated       Modifying your home environment and making it safe DO:  Install grab bars in the shower and next to the toilet  Place a rubber mat in front of seat or non- skid treads  Make sure the bathroom is well-lit    Simplifying the way  you set up tasks or daily routines DO:  Plan to bathe/shower before you're overly tired  Gather everything you will need before beginning    PRACTICE   Based on what we have talked about, you are willing to keep trying:   To use all the modifications in your knew remodeled bathroom as needed.     If an idea does not work the first time, try it again (and again). We may make some changes the next session, based on how they work.      Donny Ditch, OTR/L        10/06/2023  Occupational Therapist                        Date      COMPLETED: Patient Stated        Ease of drying hair-- a hands free hair dryer holder may help with this. In process; 8/27 MET  Name: Layney Gillson              Date: 08/03/2023  OT Action Plan: Generic  Target Problem Area:  Drying and styling hair at same time   Why Problem May Occur:    Decreased range of motion one shoulder   Target Goal(s):  Independently being able to dry and style hair at same time   STRATEGIES   Saving your Energy DO:  May consider sitting to do hair    Change your home to make it safe for you     DO:  Use hands free hair dryer holder so you don't have to try and "juggle" hair drying and trying to style at the same time; thus making your arm hurt more    PRACTICE  Based on what we have talked about, you are willing to try:  To use the hands-free hair dryer over the next 2 weeks to see how it works for you.   If an idea does not work the first time, try it again.  We may make some changes over the next few sessions, based on how they work.   Donny Ditch, OTR/L      08/03/2023        Post Clinical Reasoning:

## 2023-11-03 NOTE — Patient Outreach (Unsigned)
 Aging Gracefully Program  OT FINAL Visit  11/03/2023  LAKELYNN SEVERTSON 05-24-1950 [id number]  Visit:  6- Sixth Visit  Start Time:  1600 End Time:  1630 Total Minutes:  30  Readiness to Change:  Readiness to Change Score: 10  Patient Education: Education Provided: Yes Education Details: Tips for Aging at [employer]) Educated: Patient Comprehension: Verbalized Understanding  Goals:  Goals Addressed               This Visit's Progress     COMPLETED: Patient Stated        She would like to feel more safe with getting back and forth from car to house. A rail on both sides of their concrete ramp sidewalk will help with this. MET 8/27  OT ACTION PLAN: Functional Mobility   Target Problem Area:   Decreased safety up and down sidewalk from where car is parked and to the house    Why Problem May Occur:   Decreased balance  Pain  No hand rails  Target Goal(s):   Safety and independence up and down sidewalk from house to car    STRATEGIES   Modifying your home environment and making it safe     DO:  A handrail on each side of the sidewalk leading from car port to house     PRACTICE  Based on what we have talked about, you are willing to try:   To use at least one rail when going up and down sidewalk from car port to house.    If an idea does not work the first time, try it again (and again).  We may make some changes over the next few sessions, based on how they work.   Donny Ditch        10/24/2023 Occupational Therapist                        Date      COMPLETED: Patient Stated (pt-stated)        She would like to feel more safe in the hall bathroom. A tub to shower conversion or a tub cut out (if it works for her tub) would help with this; as well as a hand held shower with lower shower holder and 3 grab bars. Also a grab bar vertically in front of the toilet attached to the vanity. Also checking to see if the area in the hall bath on the floor  can be fixed where it is uneven. Partially met--floor needs to be fixed and grab bar put up in master bath; the rest has been completed. 8/27 MET  Name: Brigitta Pricer   Date: 10/06/2023   OT ACTION PLAN: Bathing   Target Problem Area:   Safety in the bathroom  Why Problem May Occur:    Decreased balance  Decreased movement  Pain   Target Goal(s):   Safety with showering and toileting    STRATEGIES   Saving Your Energy DO:  Use a tub seat   Keep frequently used items within easy reach  While seated use handheld shower to get wet and rinse off as much as possible while seated       Modifying your home environment and making it safe DO:  Install grab bars in the shower and next to the toilet  Place a rubber mat in front of seat or non- skid treads  Make sure the bathroom is well-lit    Simplifying the way  you set up tasks or daily routines DO:  Plan to bathe/shower before you're overly tired  Gather everything you will need before beginning    PRACTICE   Based on what we have talked about, you are willing to keep trying:   To use all the modifications in your knew remodeled bathroom as needed.     If an idea does not work the first time, try it again (and again). We may make some changes the next session, based on how they work.      Donny Ditch, OTR/L        10/06/2023  Occupational Therapist                        Date      COMPLETED: Patient Stated        Ease of drying hair-- a hands free hair dryer holder may help with this. In process; 8/27 MET  Name: Deidre Carino              Date: 08/03/2023  OT Action Plan: Generic  Target Problem Area:  Drying and styling hair at same time   Why Problem May Occur:    Decreased range of motion one shoulder   Target Goal(s):  Independently being able to dry and style hair at same time   STRATEGIES   Saving your Energy DO:  May consider sitting to do hair    Change your home to make it safe for you     DO:  Use hands free hair dryer holder so you don't have to try and "juggle" hair drying and trying to style at the same time; thus making your arm hurt more    PRACTICE  Based on what we have talked about, you are willing to try:  To use the hands-free hair dryer over the next 2 weeks to see how it works for you.   If an idea does not work the first time, try it again.  We may make some changes over the next few sessions, based on how they work.   Donny Ditch, OTR/L      08/03/2023        Post Clinical Reasoning: Clinician View Of Client Situation:: Mrs. Lincoln is happy with the new flooring in their bathroom and kitchen as well as the handrails up and down the sidewalk from the car port to the house. 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 Client View Of His/Her Situation:: Feels she is doing well and the home modifications and repairs are very appreciated. Next Visit Plan:: This was the final visit--It has been a pleasure working with Mrs. Corners Donny, OT Aging Gracefully [phone]

## 2023-11-03 NOTE — Patient Instructions (Signed)
 OT ACTION PLAN: Functional Mobility   Target Problem Area:   Decreased safety up and down sidewalk from where car is parked and to the house    Why Problem May Occur:   Decreased balance  Pain  No hand rails  Target Goal(s):   Safety and independence up and down sidewalk from house to car    STRATEGIES   Modifying your home environment and making it safe     DO:  A handrail on each side of the sidewalk leading from car port to house     PRACTICE  Based on what we have talked about, you are willing to try:   To use at least one rail when going up and down sidewalk from car port to house.    If an idea does not work the first time, try it again (and again).  We may make some changes over the next few sessions, based on how they work.   Donny Ditch        10/24/2023 Occupational Therapist                        Date

## 2023-11-20 DIAGNOSIS — R002 Palpitations: Secondary | ICD-10-CM | POA: Diagnosis not present

## 2023-11-20 DIAGNOSIS — M940 Chondrocostal junction syndrome [Tietze]: Secondary | ICD-10-CM | POA: Diagnosis not present

## 2023-11-20 DIAGNOSIS — E039 Hypothyroidism, unspecified: Secondary | ICD-10-CM | POA: Diagnosis not present

## 2023-11-20 DIAGNOSIS — I10 Essential (primary) hypertension: Secondary | ICD-10-CM | POA: Diagnosis not present

## 2023-11-23 DIAGNOSIS — Z0000 Encounter for general adult medical examination without abnormal findings: Secondary | ICD-10-CM | POA: Diagnosis not present

## 2023-11-23 DIAGNOSIS — Z23 Encounter for immunization: Secondary | ICD-10-CM | POA: Diagnosis not present

## 2023-11-23 DIAGNOSIS — I10 Essential (primary) hypertension: Secondary | ICD-10-CM | POA: Diagnosis not present

## 2023-11-23 DIAGNOSIS — K219 Gastro-esophageal reflux disease without esophagitis: Secondary | ICD-10-CM | POA: Diagnosis not present

## 2023-11-23 DIAGNOSIS — R7303 Prediabetes: Secondary | ICD-10-CM | POA: Diagnosis not present

## 2023-11-23 DIAGNOSIS — E7800 Pure hypercholesterolemia, unspecified: Secondary | ICD-10-CM | POA: Diagnosis not present

## 2023-11-23 DIAGNOSIS — J302 Other seasonal allergic rhinitis: Secondary | ICD-10-CM | POA: Diagnosis not present

## 2023-11-28 DIAGNOSIS — K219 Gastro-esophageal reflux disease without esophagitis: Secondary | ICD-10-CM | POA: Diagnosis not present

## 2023-12-05 ENCOUNTER — Encounter: Payer: Self-pay | Admitting: Physician Assistant

## 2023-12-05 ENCOUNTER — Ambulatory Visit: Attending: Physician Assistant | Admitting: Physician Assistant

## 2023-12-05 VITALS — BP 153/79 | HR 93 | Ht 70.0 in | Wt 243.0 lb

## 2023-12-05 DIAGNOSIS — I10 Essential (primary) hypertension: Secondary | ICD-10-CM | POA: Diagnosis not present

## 2023-12-05 DIAGNOSIS — R42 Dizziness and giddiness: Secondary | ICD-10-CM

## 2023-12-05 DIAGNOSIS — R079 Chest pain, unspecified: Secondary | ICD-10-CM | POA: Diagnosis not present

## 2023-12-05 MED ORDER — METOPROLOL TARTRATE 100 MG PO TABS: ORAL_TABLET | ORAL | 0 refills | Status: AC

## 2023-12-05 NOTE — Progress Notes (Unsigned)
  Cardiology Office Note   Date:  12/05/2023  ID:  Kelly Watson, DOB 12-Nov-1950, MRN [id number] PCP: Domenick Loma, NP  [facility] Providers Cardiologist:  Darryle ONEIDA Decent, MD { Click to update primary MD,subspecialty MD or APP then REFRESH:1}    History of Present Illness Kelly Watson is a 73 y.o. female with past medical history of hypertension, hyperlipidemia and venous insufficiency.  She wears a compression stocking.  She does have aspirin allergy.  She previously take Plavix  for minimal carotid artery disease, however this has been discontinued.  Echocardiogram obtained on 10/31/2019 showed EF 60 to 65%, no regional wall motion abnormality, trivial MR.  Heart monitor obtained in September 2021 showed minimal heart rate 52, maximal heart rate 134, average heart rate 73, PVC burden less than 1%, no significant arrhythmia.  Carotid ultrasound obtained on 07/18/2021 showed 1 to 39% left ICA disease, no significant disease in the right carotid artery, normal flow in the subclavian and vertebral arteries.  Venous Doppler in September 2024 showed venous reflux in lower extremity she was last seen by Dr. CLEMENTEEN Mulch in March 2025 due to chest wall pain.  Symptom was reproducible with palpation.  Patient presents today for evaluation of chest discomfort that has been going on for several months.  Previously she has soreness in the substernal area with palpation, however now the chest pain is not reproducible anymore.  It does not have clear correlation with exertion.  Recent blood work obtained at PCPs office showed normal hemoglobin.  She has also been having fatigue, intermittent shortness of breath and chronic dizziness.  Her dizzy spell has been going on for several years and does not have any correlation with body position changes.  She could be walking down the hallway when she started having sudden onset of dizziness.  I recommend a coronary CTA and we will refer her to vestibular  rehab for her dizziness.  She will need a single tablet of metoprolol  to tartrate 100 mg prior to the coronary CTA.  ROS: ***  Studies Reviewed      *** Risk Assessment/Calculations {Does this patient have ATRIAL FIBRILLATION?:[phone]} The patient's 1st BP is elevated (>139/89)*** Repeat BP and {Click to enter a 2nd BP Refresh Note  :1}       Physical Exam VS:  BP (!) 153/79 (BP Location: Left Arm, Patient Position: Sitting, Cuff Size: Normal)   Pulse 93   Ht 5' 10 (1.778 m)   Wt 243 lb (110.2 kg)   SpO2 95%   BMI 34.87 kg/m        Wt Readings from Last 3 Encounters:  12/05/23 243 lb (110.2 kg)  05/08/23 251 lb (113.9 kg)  11/27/22 242 lb 14.4 oz (110.2 kg)    GEN: Well nourished, well developed in no acute distress NECK: No JVD; No carotid bruits CARDIAC: ***RRR, no murmurs, rubs, gallops RESPIRATORY:  Clear to auscultation without rales, wheezing or rhonchi  ABDOMEN: Soft, non-tender, non-distended EXTREMITIES:  No edema; No deformity   ASSESSMENT AND PLAN ***    {Are you ordering a CV Procedure (e.g. stress test, cath, DCCV, TEE, etc)?   Press F2        :[id number]}  Dispo: ***  Signed, Scot Ford, PA

## 2023-12-05 NOTE — Patient Instructions (Addendum)
 Medication Instructions:  HOLD METOPROLOL   SUCCINATE 50 MG THE MORNING OF YOUR CTA SCAN. TAKE METOPROLOL  TARTRATE 100 MG 2 HOURS PRIOR TO CTA SCAN.  Lab Work: BMET TO BE DONE TODAY.  Testing/Procedures:   Your cardiac CT will be scheduled at one of the below locations:   [facility] [address] [place], [place] [address] [phone] (Severe contrast allergies only)  OR  [facility] [address]  [place], [place] [address] [phone]  If scheduled at [facility], please arrive at the [facility] and Children's Entrance (Entrance C2) of [facility] 30 minutes prior to test start time. You can use the FREE valet parking offered at entrance C (encouraged to control the heart rate for the test)  Proceed to the [facility] Radiology Department (first floor) to check-in and test prep.  If scheduled at the Heart and Vascular Tower at [address], please enter the parking lot using the [address] entrance and use the FREE valet service at the patient drop-off area. Enter the building and check-in with registration on the main floor.  Please follow these instructions carefully (unless otherwise directed):  An IV will be required for this test and Nitroglycerin will be given.   On the Night Before the Test: Be sure to Drink plenty of water. Do not consume any caffeinated/decaffeinated beverages or chocolate 12 hours prior to your test. Do not take any antihistamines 12 hours prior to your test.  On the Day of the Test: Drink plenty of water until 1 hour prior to the test. Do not eat any food 1 hour prior to test. You may take your regular medications prior to the test.  Take METOPROLOL  TARTRATE (LOPRESSOR ) two hours prior to test. If you take Furosemide/Hydrochlorothiazide/Spironolactone/Chlorthalidone, please HOLD on the morning of the test. Patients who wear a continuous glucose monitor MUST remove the device  prior to scanning. FEMALES- please wear underwire-free bra if available, avoid dresses & tight clothing      After the Test: Drink plenty of water. After receiving IV contrast, you may experience a mild flushed feeling. This is normal. On occasion, you may experience a mild rash up to 24 hours after the test. This is not dangerous. If this occurs, you can take Benadryl  25 mg, Zyrtec, Claritin, or Allegra and increase your fluid intake. (Patients taking Tikosyn should avoid Benadryl , and may take Zyrtec, Claritin, or Allegra) If you experience trouble breathing, this can be serious. If it is severe call 911 IMMEDIATELY. If it is mild, please call our office.  We will call to schedule your test 2-4 weeks out understanding that some insurance companies will need an authorization prior to the service being performed.   For more information and frequently asked questions, please visit our website : [email or web address]  For non-scheduling related questions, please contact the cardiac imaging nurse navigator should you have any questions/concerns: Cardiac Imaging Nurse Navigators Direct Office Dial: [phone]   For scheduling needs, including cancellations and rescheduling, please call [place], [phone].   Follow-Up: At [facility], you and your health needs are our priority.  As part of our continuing mission to provide you with exceptional heart care, our providers are all part of one team.  This team includes your primary Cardiologist (physician) and Advanced Practice Providers or APPs (Physician Assistants and Nurse Practitioners) who all work together to provide you with the care you need, when you need it.  Your next appointment:  6 MONTHS  Provider:   Darryle ONEIDA Decent, MD

## 2023-12-06 LAB — BASIC METABOLIC PANEL WITH GFR
BUN/Creatinine Ratio: 12 (ref 12–28)
BUN: 12 mg/dL (ref 8–27)
CO2: 24 mmol/L (ref 20–29)
Calcium: 9.7 mg/dL (ref 8.7–10.3)
Chloride: 102 mmol/L (ref 96–106)
Creatinine, Ser: 0.97 mg/dL (ref 0.57–1.00)
Glucose: 80 mg/dL (ref 70–99)
Potassium: 4.7 mmol/L (ref 3.5–5.2)
Sodium: 143 mmol/L (ref 134–144)
eGFR: 62 mL/min/1.73 (ref >59)

## 2023-12-07 ENCOUNTER — Ambulatory Visit: Payer: Self-pay | Admitting: Physician Assistant

## 2023-12-10 ENCOUNTER — Encounter (HOSPITAL_COMMUNITY): Payer: Self-pay

## 2023-12-12 ENCOUNTER — Ambulatory Visit (HOSPITAL_COMMUNITY)
Admission: RE | Admit: 2023-12-12 | Discharge: 2023-12-12 | Disposition: A | Discharge status: Home / Self Care | Source: Ambulatory Visit | Attending: Physician Assistant | Admitting: Physician Assistant

## 2023-12-12 DIAGNOSIS — I2510 Atherosclerotic heart disease of native coronary artery without angina pectoris: Secondary | ICD-10-CM | POA: Insufficient documentation

## 2023-12-12 DIAGNOSIS — R079 Chest pain, unspecified: Secondary | ICD-10-CM | POA: Diagnosis not present

## 2023-12-12 DIAGNOSIS — I10 Essential (primary) hypertension: Secondary | ICD-10-CM | POA: Diagnosis not present

## 2023-12-12 DIAGNOSIS — I700 Atherosclerosis of aorta: Secondary | ICD-10-CM | POA: Insufficient documentation

## 2023-12-12 MED ORDER — IOHEXOL 350 MG/ML SOLN
100.0000 mL | Freq: Once | INTRAVENOUS | Status: AC | PRN
  Administered 2023-12-12: 100 mL via INTRAVENOUS

## 2023-12-12 MED ORDER — NITROGLYCERIN 0.4 MG SL SUBL
0.8000 mg | SUBLINGUAL_TABLET | Freq: Once | SUBLINGUAL | Status: AC
  Administered 2023-12-12: 0.8 mg via SUBLINGUAL

## 2023-12-13 NOTE — Progress Notes (Signed)
 Patient reviewed CT CORONARY results on MyChart.   Last read by Meade JONETTA Corners at 2:02PM on 12/13/2023.

## 2023-12-21 NOTE — Progress Notes (Signed)
 Non-cardiac portion read by radiologist. Small hiatal hernia, but no acute finding.

## 2024-01-09 ENCOUNTER — Other Ambulatory Visit: Payer: Self-pay

## 2024-01-09 NOTE — Patient Outreach (Signed)
 Aging Gracefully Program  01/09/2024  Kelly Watson 1950-12-01 [id number]   [facility] Evaluation Interviewer made contact with patient. Aging Gracefully 5 month survey completed.     Shereen [facility]  Population Health Care Management Assistant  Direct Dial: [phone]  Fax: [phone] Website: [email or web address]

## 2024-01-28 DIAGNOSIS — R1084 Generalized abdominal pain: Secondary | ICD-10-CM | POA: Diagnosis not present

## 2024-01-28 DIAGNOSIS — K219 Gastro-esophageal reflux disease without esophagitis: Secondary | ICD-10-CM | POA: Diagnosis not present

## 2024-01-30 ENCOUNTER — Ambulatory Visit (HOSPITAL_BASED_OUTPATIENT_CLINIC_OR_DEPARTMENT_OTHER)
Admission: RE | Admit: 2024-01-30 | Discharge: 2024-01-30 | Disposition: A | Source: Ambulatory Visit | Attending: Family Medicine | Admitting: Family Medicine

## 2024-01-30 DIAGNOSIS — M8580 Other specified disorders of bone density and structure, unspecified site: Secondary | ICD-10-CM | POA: Insufficient documentation

## 2024-01-30 DIAGNOSIS — Z780 Asymptomatic menopausal state: Secondary | ICD-10-CM | POA: Insufficient documentation

## 2024-03-06 ENCOUNTER — Other Ambulatory Visit: Payer: Self-pay | Admitting: Family Medicine

## 2024-03-06 DIAGNOSIS — Z1231 Encounter for screening mammogram for malignant neoplasm of breast: Secondary | ICD-10-CM

## 2024-03-12 ENCOUNTER — Other Ambulatory Visit

## 2024-04-07 ENCOUNTER — Ambulatory Visit

## 2024-06-04 ENCOUNTER — Ambulatory Visit: Admitting: Physician Assistant
